# Patient Record
Sex: Male | Born: 1955 | Race: Asian | Hispanic: No | Marital: Married | Smoking: Never smoker
Health system: Southern US, Community
[De-identification: ages and names within clinical notes are randomized; demographics above are authoritative.]

## PROBLEM LIST (undated history)

## (undated) DIAGNOSIS — E119 Type 2 diabetes mellitus without complications: Secondary | ICD-10-CM

## (undated) DIAGNOSIS — E785 Hyperlipidemia, unspecified: Secondary | ICD-10-CM

---

## 2006-10-15 ENCOUNTER — Emergency Department (HOSPITAL_COMMUNITY): Admission: EM | Admit: 2006-10-15 | Discharge: 2006-10-15 | Payer: Self-pay | Admitting: Emergency Medicine

## 2008-12-25 ENCOUNTER — Emergency Department (HOSPITAL_COMMUNITY): Admission: EM | Admit: 2008-12-25 | Discharge: 2008-12-25 | Payer: Self-pay | Admitting: Family Medicine

## 2010-04-13 ENCOUNTER — Emergency Department (HOSPITAL_COMMUNITY): Admission: EM | Admit: 2010-04-13 | Discharge: 2010-04-13 | Payer: Self-pay | Admitting: Family Medicine

## 2010-08-29 ENCOUNTER — Emergency Department (HOSPITAL_COMMUNITY)
Admission: EM | Admit: 2010-08-29 | Discharge: 2010-08-29 | Payer: Self-pay | Source: Home / Self Care | Admitting: Family Medicine

## 2011-01-27 ENCOUNTER — Inpatient Hospital Stay (INDEPENDENT_AMBULATORY_CARE_PROVIDER_SITE_OTHER)
Admission: RE | Admit: 2011-01-27 | Discharge: 2011-01-27 | Disposition: A | Discharge status: Home / Self Care | Payer: BC Managed Care – PPO | Source: Ambulatory Visit | Attending: Family Medicine | Admitting: Family Medicine

## 2011-01-27 DIAGNOSIS — M779 Enthesopathy, unspecified: Secondary | ICD-10-CM

## 2011-07-01 ENCOUNTER — Other Ambulatory Visit: Payer: Self-pay | Admitting: Gastroenterology

## 2011-07-01 DIAGNOSIS — B181 Chronic viral hepatitis B without delta-agent: Secondary | ICD-10-CM

## 2011-07-02 ENCOUNTER — Ambulatory Visit
Admission: RE | Admit: 2011-07-02 | Discharge: 2011-07-02 | Disposition: A | Discharge status: Home / Self Care | Payer: BC Managed Care – PPO | Source: Ambulatory Visit | Attending: Gastroenterology | Admitting: Gastroenterology

## 2011-07-02 DIAGNOSIS — B181 Chronic viral hepatitis B without delta-agent: Secondary | ICD-10-CM

## 2015-11-27 ENCOUNTER — Encounter (HOSPITAL_COMMUNITY): Payer: Self-pay

## 2015-11-27 ENCOUNTER — Emergency Department (HOSPITAL_COMMUNITY)
Admission: EM | Admit: 2015-11-27 | Discharge: 2015-11-27 | Disposition: A | Discharge status: Home / Self Care | Payer: Worker's Compensation | Attending: Emergency Medicine | Admitting: Emergency Medicine

## 2015-11-27 ENCOUNTER — Emergency Department (HOSPITAL_COMMUNITY): Payer: Worker's Compensation

## 2015-11-27 DIAGNOSIS — S61313A Laceration without foreign body of left middle finger with damage to nail, initial encounter: Secondary | ICD-10-CM | POA: Diagnosis present

## 2015-11-27 DIAGNOSIS — Y999 Unspecified external cause status: Secondary | ICD-10-CM | POA: Diagnosis not present

## 2015-11-27 DIAGNOSIS — Y9289 Other specified places as the place of occurrence of the external cause: Secondary | ICD-10-CM | POA: Diagnosis not present

## 2015-11-27 DIAGNOSIS — W260XXA Contact with knife, initial encounter: Secondary | ICD-10-CM | POA: Diagnosis not present

## 2015-11-27 DIAGNOSIS — Z23 Encounter for immunization: Secondary | ICD-10-CM | POA: Diagnosis not present

## 2015-11-27 DIAGNOSIS — Y9389 Activity, other specified: Secondary | ICD-10-CM | POA: Diagnosis not present

## 2015-11-27 DIAGNOSIS — S61309A Unspecified open wound of unspecified finger with damage to nail, initial encounter: Secondary | ICD-10-CM

## 2015-11-27 MED ORDER — IBUPROFEN 800 MG PO TABS: 800.0000 mg | ORAL_TABLET | Freq: Three times a day (TID) | ORAL | Status: DC

## 2015-11-27 MED ORDER — CEPHALEXIN 500 MG PO CAPS
500.0000 mg | ORAL_CAPSULE | Freq: Once | ORAL | Status: AC
  Administered 2015-11-27: 500 mg via ORAL
  Filled 2015-11-27: qty 1

## 2015-11-27 MED ORDER — TETANUS-DIPHTH-ACELL PERTUSSIS 5-2.5-18.5 LF-MCG/0.5 IM SUSP
INTRAMUSCULAR | Status: AC
  Filled 2015-11-27: qty 0.5

## 2015-11-27 MED ORDER — TETANUS-DIPHTH-ACELL PERTUSSIS 5-2.5-18.5 LF-MCG/0.5 IM SUSP
0.5000 mL | Freq: Once | INTRAMUSCULAR | Status: AC
Start: 2015-11-27 — End: 2015-11-27
  Administered 2015-11-27: 0.5 mL via INTRAMUSCULAR

## 2015-11-27 MED ORDER — CEPHALEXIN 500 MG PO CAPS: 500.0000 mg | ORAL_CAPSULE | Freq: Four times a day (QID) | ORAL | Status: DC

## 2015-11-27 NOTE — ED Notes (Signed)
Pt had cut self with knife to left middle finger around 1730, denies numbness, denies any medical hx or allergies, denies taking ASA

## 2015-11-27 NOTE — Discharge Instructions (Signed)
Nail Bed Injury  The nail bed is the soft tissue under a fingernail or toenail. Various types of injuries can occur at the nail bed. These may involve bruising or bleeding under the nail, cuts in the nail or nail bed, or loss of the nail or a part of it. It can take several months for a damaged nail to regrow. In some cases, the nail may not grow back normally.  HOME CARE  · Raise (elevate) the injured part to lessen pain and puffiness (swelling).      For an injured toenail, lie with your leg on pillows. Avoid walking or letting your leg dangle. When you walk, wear an open-toe shoe.      For an injured fingernail, keep your hand above the level of your heart. Use pillows on a table or on the arm of your chair while sitting. Use pillows on your bed while sleeping.    · Use bandages or splints as told by your doctor.    · Keep your bandage (dressing) clean and dry. Change your bandage as told by your doctor.    · Only take medicine as told by your doctor.    · See your doctor as needed.  GET HELP RIGHT AWAY IF:   · You have more pain, leaking fluid (drainage), or bleeding in the injured area.    · You have redness, soreness, and puffiness (inflammation) in the injured area.  · You have a fever or lasting symptoms for more than 2-3 days.  · You have a fever and your symptoms suddenly get worse.  · You have puffiness that spreads from your finger into your hand or from your toe into your foot.    MAKE SURE YOU:  · Understand these instructions.  · Will watch this condition.  · Will get help right away if you are not doing well or get worse.     This information is not intended to replace advice given to you by your health care provider. Make sure you discuss any questions you have with your health care provider.     Document Released: 07/22/2009 Document Revised: 11/28/2012 Document Reviewed: 08/25/2012  Elsevier Interactive Patient Education ©2016 Elsevier Inc.

## 2015-11-27 NOTE — ED Notes (Signed)
Pt cut his L middle finger on a machine at work.

## 2015-11-29 ENCOUNTER — Encounter (HOSPITAL_COMMUNITY): Payer: Self-pay | Admitting: Emergency Medicine

## 2015-11-29 ENCOUNTER — Emergency Department (HOSPITAL_COMMUNITY)
Admission: EM | Admit: 2015-11-29 | Discharge: 2015-11-29 | Disposition: A | Discharge status: Home / Self Care | Payer: Worker's Compensation | Attending: Emergency Medicine | Admitting: Emergency Medicine

## 2015-11-29 DIAGNOSIS — Z4801 Encounter for change or removal of surgical wound dressing: Secondary | ICD-10-CM | POA: Insufficient documentation

## 2015-11-29 DIAGNOSIS — Z5189 Encounter for other specified aftercare: Secondary | ICD-10-CM

## 2015-11-29 NOTE — Discharge Instructions (Signed)
The wound to the left middle finger is healing nicely. Please wash the area gently with soap and water. Use the splint and dressing for another 5 days. Please finish the keflex. Use the ibuprofen three times daily with food if needed for pain. Return to the Emergency Department if not improving. Fingernail or Toenail Removal, Care After Refer to this sheet in the next few weeks. These instructions provide you with information about caring for yourself after your procedure. Your health care provider may also give you more specific instructions. Your treatment has been planned according to current medical practices, but problems sometimes occur. Call your health care provider if you have any problems or questions after your procedure. WHAT TO EXPECT AFTER THE PROCEDURE After your procedure, it is common to have:  Redness.  Swelling. HOME CARE INSTRUCTIONS  If you have a splint on your finger:  Wear it as directed by your health care provider. Remove it only as directed by your health care provider.  Loosen the splint if your fingers become numb and tingle, or if they turn cold and blue.  If you were given a surgical shoe, wear it as directed by your health care provider.  Take medicines only as directed by your health care provider.  Elevate your hand or foot as much of the time as possible. This helps with pain and swelling.  If you are recovering from fingernail removal, keep your hand raised above the level of your heart.  If you are recovering from toenail removal, lie on a bed or a couch with your leg propped up on pillows, or sit in a reclining chair with the footrest up.  Follow instructions from your health care provider about bandage (dressing) changes and removal:  Change your dressing 24 hours after your procedure or as directed by your health care provider.  Soak your hand or foot in warm, soapy water for 10-20 minutes or as directed by your health care provider. Do this 3  times per day or as directed by your health care provider. This reduces pain and swelling.  After you soak your hand or foot, apply a clean, dry dressing.  Keep your dressing clean and dry. Change your dressing whenever it gets wet or dirty.  Keep all follow-up visits as directed by your health care provider. This is important. SEEK MEDICAL CARE IF:  You have increased redness or pain at your nail area.  You have increased fluid, blood, or pus coming from your nail area.  There is a bad smell coming from the dressing.  You have a fever.  Your swelling gets worse, or you have swelling that spreads from your finger to your hand or from your toe to your foot.  You have worsening redness that spreads from your finger to your hand or from your toe up to your foot.  Your finger or toe looks blue or black.   This information is not intended to replace advice given to you by your health care provider. Make sure you discuss any questions you have with your health care provider.   Document Released: 08/24/2014 Document Reviewed: 08/24/2014 Elsevier Interactive Patient Education [employer]2016 Elsevier Inc.

## 2015-11-29 NOTE — ED Notes (Signed)
Finger splint and dressing removed for assessment.

## 2015-11-29 NOTE — ED Notes (Addendum)
Pt here for follow up in regards to an avulsion of LT, middle fingernail. Pt treated in ED by Pauline Ausammy Triplett on 11/27/15. Pt has not other complaints. Pt is not [place] speaking. Grandson at bedside. Pt has splint on finger. No drainage noted.

## 2015-11-29 NOTE — ED Provider Notes (Signed)
CSN: [id number]     Arrival date & time 11/29/15  [id number] History   First MD Initiated Contact with Patient 11/29/15 1002     Chief Complaint  Patient presents with  . Wound Check     (Consider location/radiation/quality/duration/timing/severity/associated sxs/prior Treatment) HPI Comments: Patient is a 60 year old male who presents to the emergency department for wound recheck of his left middle finger.  The patient has limited [place], and his grandson is serving as his interpreter at the bedside.  The patient had an avulsion of the nail of the left middle finger on April 12. He states that he cut himself with a knife at that time. The patient was evaluated the patient was placed in a splint and dressing was applied. The patient was placed on antibiotic therapy and pain management therapy. He returns today for recheck of his wound. He denies any problems with fever. He's not had any problem with bleeding. He has some mild soreness, but no serious pain.  Patient is a 60 y.o. male presenting with wound check. The history is provided by the patient and a relative. The history is limited by a language barrier. A language interpreter was used.  Wound Check The current episode started in the past 7 days. Pertinent negatives include no abdominal pain, arthralgias, chest pain, coughing or neck pain.    History reviewed. No pertinent past medical history. History reviewed. No pertinent past surgical history. No family history on file. Social History  Substance Use Topics  . Smoking status: Never Smoker   . Smokeless tobacco: None  . Alcohol Use: Yes     Comment: occ    Review of Systems  Constitutional: Negative for activity change.       All ROS Neg except as noted in HPI  HENT: Negative for nosebleeds.   Eyes: Negative for photophobia and discharge.  Respiratory: Negative for cough, shortness of breath and wheezing.   Cardiovascular: Negative for chest pain and palpitations.   Gastrointestinal: Negative for abdominal pain and blood in stool.  Genitourinary: Negative for dysuria, frequency and hematuria.  Musculoskeletal: Negative for back pain, arthralgias and neck pain.  Skin: Positive for wound.  Neurological: Negative for dizziness, seizures and speech difficulty.  Psychiatric/Behavioral: Negative for hallucinations and confusion.      Allergies  Review of patient's allergies indicates no known allergies.  Home Medications   Prior to Admission medications   Medication Sig Start Date End Date Taking? Authorizing Provider  cephALEXin (KEFLEX) 500 MG capsule Take 1 capsule (500 mg total) by mouth 4 (four) times daily. For 7 days 11/27/15   Tammy Triplett, PA-C  ibuprofen (ADVIL,MOTRIN) 800 MG tablet Take 1 tablet (800 mg total) by mouth 3 (three) times daily. 11/27/15   Tammy Triplett, PA-C   BP 148/104 mmHg  Pulse 71  Temp(Src) 97.5 F (36.4 C) (Axillary)  Resp 14  SpO2 100% Physical Exam  Constitutional: He is oriented to person, place, and time. He appears well-developed and well-nourished.  Non-toxic appearance.  HENT:  Head: Normocephalic.  Right Ear: Tympanic membrane and external ear normal.  Left Ear: Tympanic membrane and external ear normal.  Eyes: EOM and lids are normal. Pupils are equal, round, and reactive to light.  Neck: Normal range of motion. Neck supple. Carotid bruit is not present.  Cardiovascular: Normal rate, regular rhythm, normal heart sounds, intact distal pulses and normal pulses.   Pulmonary/Chest: Breath sounds normal. No respiratory distress.  Abdominal: Soft. Bowel sounds are normal. There is no tenderness.  There is no guarding.  Musculoskeletal: Normal range of motion.  The avulsed area of the left middle finger nail is healing nicely. No red streaks. The finger is not hot. No palpable nodes of the bicep area. Good ROM of the left middle finger, and all fingers of the left hand.  Lymphadenopathy:       Head (right  side): No submandibular adenopathy present.       Head (left side): No submandibular adenopathy present.    He has no cervical adenopathy.  Neurological: He is alert and oriented to person, place, and time. He has normal strength. No cranial nerve deficit or sensory deficit.  Skin: Skin is warm and dry.  Psychiatric: He has a normal mood and affect. His speech is normal.  Nursing note and vitals reviewed.   ED Course  Procedures (including critical care time) Labs Review Labs Reviewed - No data to display  Imaging Review Dg Finger Middle Left  11/27/2015  CLINICAL DATA:  Long finger laceration at work today. EXAM: LEFT MIDDLE FINGER 2+V COMPARISON:  None. FINDINGS: Distal soft tissue injury is noted with skin irregularity, localizing to the nail bed on the lateral view. There are several tiny radiodensities within the soft tissues, suspicious for foreign bodies. No evidence of acute fracture, dislocation or bone destruction. Mild interphalangeal degenerative changes are present throughout the visualized fingers. IMPRESSION: Distal soft tissue injury in the long finger with probable associated small foreign bodies. No acute osseous findings. Electronically Signed   By: Carey Bullocks M.D.   On: 11/27/2015 19:12   I have personally reviewed and evaluated these images and lab results as part of my medical decision-making.   EKG Interpretation None      MDM  The avulsed area of the left middle finger is progressing nicely. There is no red streaks, there is no signs of advancing infection. The patient has good movement of the middle finger. I have advised the patient to continue the Keflex and the ibuprofen. To continue to use the dressing and splint over the next 5 days. The patient is to see his primary physician, or return to the emergency department if not improving. The patient acknowledges understanding of these discharge instructions through his grandson who is serving as his  interpreter.    Final diagnoses:  Visit for wound check    **I have reviewed nursing notes, vital signs, and all appropriate lab and imaging results for this patient.Ivery Quale, PA-C 11/29/15 1036  Glynn Octave, MD 11/29/15 1249

## 2015-11-29 NOTE — ED Provider Notes (Signed)
CSN: [id number]     Arrival date & time 11/27/15  1720 History   First MD Initiated Contact with Patient 11/27/15 1813     Chief Complaint  Patient presents with  . Laceration     (Consider location/radiation/quality/duration/timing/severity/associated sxs/prior Treatment) Patient is a 60 y.o. male presenting with skin laceration. The history is provided by a friend. The history is limited by a language barrier. A language interpreter was used.  Laceration   Nicholas Johnston is a 60 y.o. male who presents to the Emergency Department complaining of a work related injury to the left middle finger that occurred shortly before ED arrival.  He states that he cut the fingernail on a machine and cannot control the bleeding.  He denies numbness, difficulty moving the finger, and swelling.  Td is unknown.     History reviewed. No pertinent past medical history. History reviewed. No pertinent past surgical history. No family history on file. Social History  Substance Use Topics  . Smoking status: Never Smoker   . Smokeless tobacco: None  . Alcohol Use: Yes     Comment: occ    Review of Systems  Constitutional: Negative for fever and chills.  Musculoskeletal: Negative for back pain, joint swelling and arthralgias.  Skin: Positive for wound.       Injury to the fingernail of the left middle finger  Neurological: Negative for dizziness, weakness and numbness.  Hematological: Does not bruise/bleed easily.  All other systems reviewed and are negative.     Allergies  Review of patient's allergies indicates no known allergies.  Home Medications   Prior to Admission medications   Medication Sig Start Date End Date Taking? Authorizing Provider  cephALEXin (KEFLEX) 500 MG capsule Take 1 capsule (500 mg total) by mouth 4 (four) times daily. For 7 days 11/27/15   Kandance Yano, PA-C  ibuprofen (ADVIL,MOTRIN) 800 MG tablet Take 1 tablet (800 mg total) by mouth 3 (three) times daily. 11/27/15   Cass Edinger, PA-C   BP 160/103 mmHg  Pulse 77  Temp(Src) 98 F (36.7 C) (Oral)  Resp 16  Ht  (1.651 m)  Wt 71.85 kg  BMI 26.36 kg/m2  SpO2 100% Physical Exam  Constitutional: He is oriented to person, place, and time. He appears well-developed and well-nourished. No distress.  HENT:  Head: Normocephalic and atraumatic.  Cardiovascular: Normal rate, regular rhythm and intact distal pulses.   No murmur heard. Pulmonary/Chest: Effort normal and breath sounds normal. No respiratory distress.  Musculoskeletal: He exhibits no edema or tenderness.  Pt has FROM of the left middle finger.  Distal sensation intact  Neurological: He is alert and oriented to person, place, and time. He exhibits normal muscle tone. Coordination normal.  Skin: Skin is warm. Laceration noted.  Partial avulsion of the central fingernail of the left middle finger.  Small bleeder exposed.  Margins of the nail and skin of the finger intact.   Nursing note and vitals reviewed.   ED Course  Procedures (including critical care time) Labs Review Labs Reviewed - No data to display  Imaging Review Dg Finger Middle Left  11/27/2015  CLINICAL DATA:  Long finger laceration at work today. EXAM: LEFT MIDDLE FINGER 2+V COMPARISON:  None. FINDINGS: Distal soft tissue injury is noted with skin irregularity, localizing to the nail bed on the lateral view. There are several tiny radiodensities within the soft tissues, suspicious for foreign bodies. No evidence of acute fracture, dislocation or bone destruction. Mild interphalangeal degenerative changes  are present throughout the visualized fingers. IMPRESSION: Distal soft tissue injury in the long finger with probable associated small foreign bodies. No acute osseous findings. Electronically Signed   By: Carey BullocksWilliam  Veazey M.D.   On: 11/27/2015 19:12   I have personally reviewed and evaluated these images and lab results as part of my medical decision-making.   Procedure: Wound  cleaned with saline, bleeding controlled using quick clot powder and direct pressure applied.  Bleeding controlled. No suturable injury.  MDM   Final diagnoses:  Avulsion of fingernail, initial encounter    Pt has been observed without further bleeding.  NVI.  Finger bandaged and splinted.  rx for ibuprofen and keflex.  Td updated.  Pt agrees to return here in 2 days for recheck    Nicholas Ausammy Joyell Emami, PA-C 11/29/15 1656  Bethann BerkshireJoseph Zammit, MD 11/30/15 [phone]

## 2017-09-19 IMAGING — DX DG FINGER MIDDLE 2+V*L*
3 series · 3 of 3 positions shown · non-contrast
Comparison: None.

CLINICAL DATA: Long finger laceration at work today.

EXAM:
LEFT MIDDLE FINGER 2+V

[finger ap]
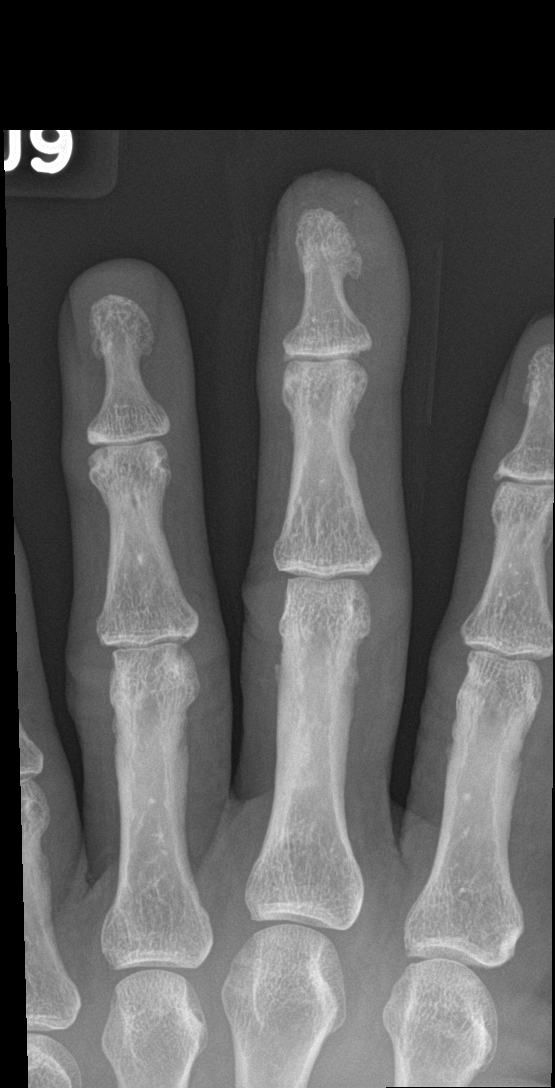

[finger obl]
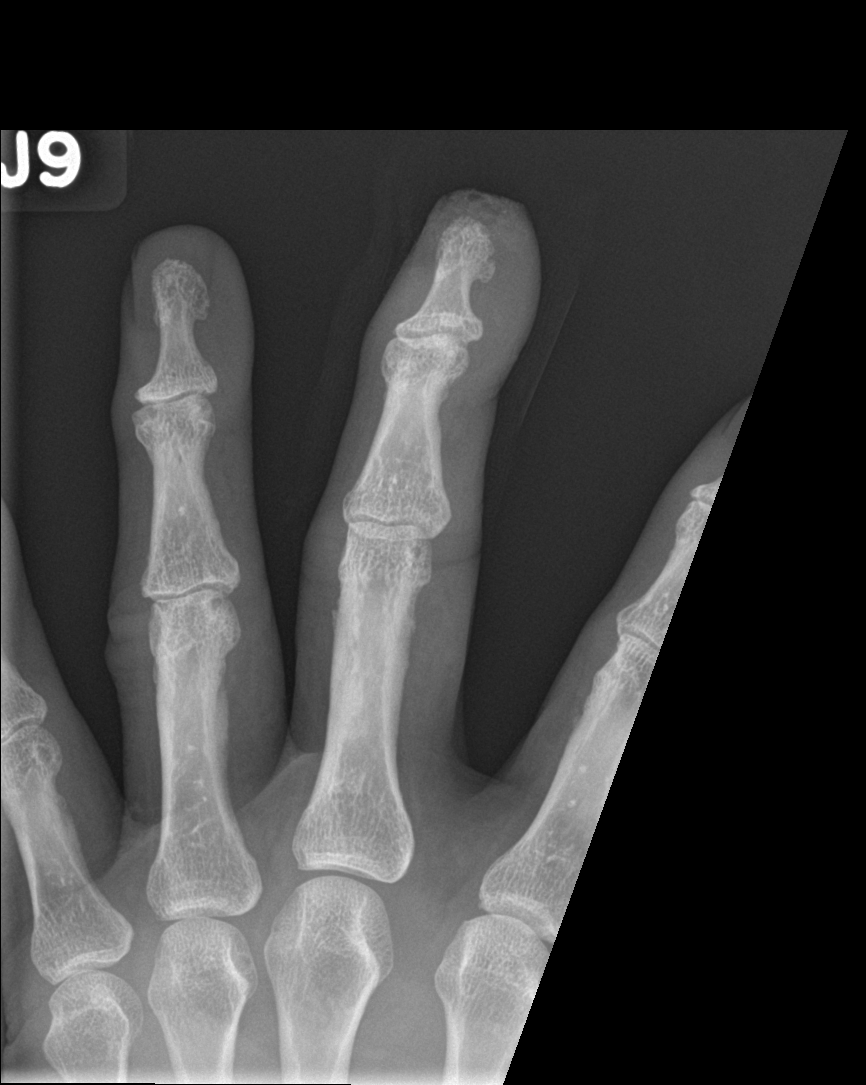

[finger lat]
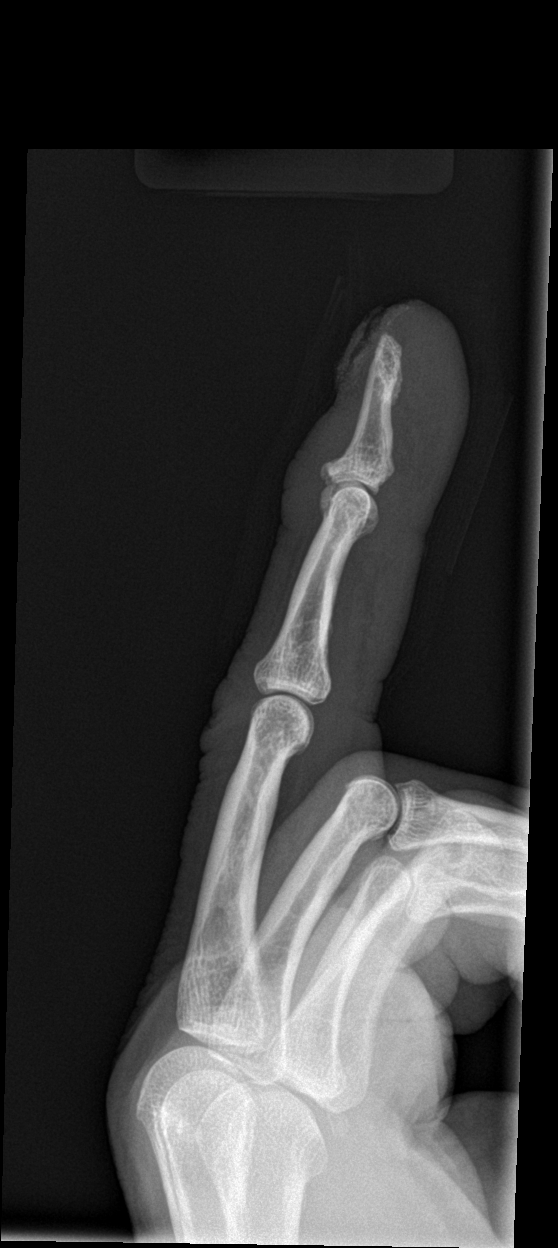

[3 of 3 positions shown; findings below may reference images not displayed]

FINDINGS: Distal soft tissue injury is noted with skin irregularity,
localizing to the nail bed on the lateral view. There are several
tiny radiodensities within the soft tissues, suspicious for foreign
bodies. No evidence of acute fracture, dislocation or bone
destruction. Mild interphalangeal degenerative changes are present
throughout the visualized fingers.
IMPRESSION: Distal soft tissue injury in the long finger with probable
associated small foreign bodies. No acute osseous findings.

## 2019-09-26 ENCOUNTER — Other Ambulatory Visit: Payer: Self-pay | Admitting: Gastroenterology

## 2019-09-26 DIAGNOSIS — B181 Chronic viral hepatitis B without delta-agent: Secondary | ICD-10-CM

## 2020-02-29 ENCOUNTER — Ambulatory Visit: Payer: Self-pay | Attending: Internal Medicine

## 2020-02-29 DIAGNOSIS — Z23 Encounter for immunization: Secondary | ICD-10-CM

## 2020-02-29 NOTE — Progress Notes (Signed)
° °  Cov[facility] Vaccination [facility]  Name:  Othniel Maret    MRN: [id number] DOB: Sep 04, 1955  02/29/2020  Mr. Hammond was observed post Covid-19 immunization for 15 minutes without incident. He was provided with Vaccine Information Sheet and instruction to access the V-Safe system.   Mr. Wellbrock was instructed to call 911 with any severe reactions post vaccine:  Difficulty breathing   Swelling of face and throat   A fast heartbeat   A bad rash all over body   Dizziness and weakness   Immunizations Administered    Name Date Dose VIS Date Route   Pfizer COVID-19 Vaccine 02/29/2020 10:35 AM 0.3 mL 10/11/2018 Intramuscular   Manufacturer: [employer], [employer]   Lot: [id number]   NDC: [address]-8

## 2020-04-02 ENCOUNTER — Ambulatory Visit: Payer: Self-pay

## 2021-12-26 DIAGNOSIS — M2550 Pain in unspecified joint: Secondary | ICD-10-CM | POA: Diagnosis not present

## 2021-12-26 DIAGNOSIS — E559 Vitamin D deficiency, unspecified: Secondary | ICD-10-CM | POA: Diagnosis not present

## 2021-12-26 DIAGNOSIS — E1165 Type 2 diabetes mellitus with hyperglycemia: Secondary | ICD-10-CM | POA: Diagnosis not present

## 2021-12-26 DIAGNOSIS — E782 Mixed hyperlipidemia: Secondary | ICD-10-CM | POA: Diagnosis not present

## 2021-12-26 DIAGNOSIS — M25519 Pain in unspecified shoulder: Secondary | ICD-10-CM | POA: Diagnosis not present

## 2022-04-14 ENCOUNTER — Ambulatory Visit (HOSPITAL_COMMUNITY)
Admission: EM | Admit: 2022-04-14 | Discharge: 2022-04-14 | Disposition: A | Discharge status: Home / Self Care | Payer: BC Managed Care – PPO | Attending: Emergency Medicine | Admitting: Emergency Medicine

## 2022-04-14 ENCOUNTER — Emergency Department (HOSPITAL_COMMUNITY)
Admission: EM | Admit: 2022-04-14 | Discharge: 2022-04-14 | Disposition: A | Discharge status: Home / Self Care | Payer: BC Managed Care – PPO | Attending: Emergency Medicine | Admitting: Emergency Medicine

## 2022-04-14 ENCOUNTER — Other Ambulatory Visit: Payer: Self-pay

## 2022-04-14 ENCOUNTER — Encounter (HOSPITAL_COMMUNITY): Payer: Self-pay | Admitting: *Deleted

## 2022-04-14 ENCOUNTER — Ambulatory Visit (INDEPENDENT_AMBULATORY_CARE_PROVIDER_SITE_OTHER): Payer: BC Managed Care – PPO

## 2022-04-14 ENCOUNTER — Encounter (HOSPITAL_COMMUNITY): Payer: Self-pay

## 2022-04-14 DIAGNOSIS — X58XXXA Exposure to other specified factors, initial encounter: Secondary | ICD-10-CM | POA: Diagnosis not present

## 2022-04-14 DIAGNOSIS — Z23 Encounter for immunization: Secondary | ICD-10-CM | POA: Insufficient documentation

## 2022-04-14 DIAGNOSIS — S92532A Displaced fracture of distal phalanx of left lesser toe(s), initial encounter for closed fracture: Secondary | ICD-10-CM | POA: Diagnosis not present

## 2022-04-14 DIAGNOSIS — S91219A Laceration without foreign body of unspecified toe(s) with damage to nail, initial encounter: Secondary | ICD-10-CM

## 2022-04-14 DIAGNOSIS — Z9181 History of falling: Secondary | ICD-10-CM | POA: Insufficient documentation

## 2022-04-14 DIAGNOSIS — S92532B Displaced fracture of distal phalanx of left lesser toe(s), initial encounter for open fracture: Secondary | ICD-10-CM | POA: Diagnosis not present

## 2022-04-14 DIAGNOSIS — S99922A Unspecified injury of left foot, initial encounter: Secondary | ICD-10-CM

## 2022-04-14 DIAGNOSIS — S91115A Laceration without foreign body of left lesser toe(s) without damage to nail, initial encounter: Secondary | ICD-10-CM | POA: Diagnosis not present

## 2022-04-14 DIAGNOSIS — S97122A Crushing injury of left lesser toe(s), initial encounter: Secondary | ICD-10-CM

## 2022-04-14 DIAGNOSIS — S91215A Laceration without foreign body of left lesser toe(s) with damage to nail, initial encounter: Secondary | ICD-10-CM

## 2022-04-14 MED ORDER — BUPIVACAINE HCL (PF) 0.5 % IJ SOLN
10.0000 mL | Freq: Once | INTRAMUSCULAR | Status: AC
  Administered 2022-04-14: 10 mL
  Filled 2022-04-14: qty 10

## 2022-04-14 MED ORDER — CEPHALEXIN 500 MG PO CAPS: 500.0000 mg | ORAL_CAPSULE | Freq: Four times a day (QID) | ORAL | 0 refills | Status: DC

## 2022-04-14 MED ORDER — HYDROCODONE-ACETAMINOPHEN 5-325 MG PO TABS: 1.0000 | ORAL_TABLET | Freq: Four times a day (QID) | ORAL | 0 refills | Status: DC | PRN

## 2022-04-14 MED ORDER — MORPHINE SULFATE (PF) 2 MG/ML IV SOLN
2.0000 mg | Freq: Once | INTRAVENOUS | Status: AC
  Administered 2022-04-14: 2 mg via INTRAMUSCULAR

## 2022-04-14 MED ORDER — CEPHALEXIN 250 MG PO CAPS
500.0000 mg | ORAL_CAPSULE | Freq: Once | ORAL | Status: AC
  Administered 2022-04-14: 500 mg via ORAL
  Filled 2022-04-14: qty 2

## 2022-04-14 MED ORDER — TETANUS-DIPHTH-ACELL PERTUSSIS 5-2.5-18.5 LF-MCG/0.5 IM SUSY
0.5000 mL | PREFILLED_SYRINGE | Freq: Once | INTRAMUSCULAR | Status: AC
  Administered 2022-04-14: 0.5 mL via INTRAMUSCULAR
  Filled 2022-04-14: qty 0.5

## 2022-04-14 MED ORDER — MORPHINE SULFATE (PF) 4 MG/ML IV SOLN
INTRAVENOUS | Status: AC
  Filled 2022-04-14: qty 1

## 2022-04-14 NOTE — ED Provider Notes (Signed)
[facility] EMERGENCY DEPARTMENT Provider Note   CSN: [id number] Arrival date & time: 04/14/22  1247     History  Chief Complaint  Patient presents with   Toe Injury    Godwin Tedesco is a 66 y.o. male.  Patient presents the emergency department for evaluation of left third toe laceration, open comminuted fracture.  Patient dropped a pallet of wood on the area at around 10 AM today.  He was at urgent care and then referred to the emergency department where he had an extended wait time.  Complains of pain in the toe.  Unknown last tetanus.  No history of diabetes or immunocompromise.  Family member at bedside interprets, declines medical interpreter.       Home Medications Prior to Admission medications   Medication Sig Start Date End Date Taking? Authorizing Provider  cephALEXin (KEFLEX) 500 MG capsule Take 1 capsule (500 mg total) by mouth 4 (four) times daily. For 7 days 11/27/15   Triplett, Tammy, PA-C  ibuprofen (ADVIL,MOTRIN) 800 MG tablet Take 1 tablet (800 mg total) by mouth 3 (three) times daily. 11/27/15   Triplett, Tammy, PA-C      Allergies    Patient has no known allergies.    Review of Systems   Review of Systems  Physical Exam Updated Vital Signs BP 113/72 (BP Location: Right Arm)   Pulse 74   Temp 98.9 F (37.2 C) (Oral)   Resp 18   Ht 5\' 5"  (1.651 m)   Wt 71.7 kg   SpO2 98%   BMI 26.29 kg/m   Physical Exam Vitals and nursing note reviewed.  Constitutional:      Appearance: He is well-developed.  HENT:     Head: Normocephalic and atraumatic.  Eyes:     Conjunctiva/sclera: Conjunctivae normal.  Pulmonary:     Effort: No respiratory distress.  Musculoskeletal:     Cervical back: Normal range of motion and neck supple.     Comments: Right third toe: Deep complex laceration noted.  Extends approximately 3 cm total length.  Wound is irregular.  There is irregular macerated nailbed laceration.  Toenail is mostly avulsed from the nail bed  and able to be moved to the side.  Base of wound appears clean.  Appears to be open underlying fracture, however no bony fragments visualized.  Remainder of foot appears to be without injury.  Skin:    General: Skin is warm and dry.  Neurological:     Mental Status: He is alert.         ED Results / Procedures / Treatments   Labs (all labs ordered are listed, but only abnormal results are displayed) Labs Reviewed - No data to display  EKG None  Radiology DG Toe 3rd Left  Result Date: 04/14/2022 CLINICAL DATA:  Crush injury toe today EXAM: LEFT THIRD TOE COMPARISON:  None Available. FINDINGS: Comminuted fracture of the distal phalanx of the third toe. Multiple displaced fracture fragments. Fracture extends into the D IP joint. No other fracture or arthropathy. IMPRESSION: Comminuted fracture of the distal third phalanx. Electronically Signed   By: 04/16/2022 M.D.   On: 04/14/2022 12:25    Procedures .08/31/2023Laceration Repair  Date/Time: 04/14/2022 10:28 PM  Performed by: 04/16/2022, PA-C Authorized by: Renne Crigler, PA-C   Consent:    Consent obtained:  Verbal   Consent given by:  Patient   Risks discussed:  Infection, poor cosmetic result, poor wound healing, need for additional repair and  pain Universal protocol:    Patient identity confirmed:  Verbally with patient Anesthesia:    Anesthesia method:  Nerve block   Block location:  L 3rd toe   Block needle gauge:  27 G   Block anesthetic:  Bupivacaine 0.5% w/o epi   Block technique:  3-sided ring block   Block injection procedure:  Anatomic landmarks identified, introduced needle, incremental injection, negative aspiration for blood and anatomic landmarks palpated   Block outcome:  Anesthesia achieved Laceration details:    Location:  Toe   Toe location:  L third toe   Length (cm):  3 Pre-procedure details:    Preparation:  Imaging obtained to evaluate for foreign bodies and patient was prepped and draped in  usual sterile fashion Exploration:    Hemostasis achieved with:  Tourniquet (medium)   Imaging obtained: x-ray     Imaging outcome: foreign body not noted     Wound exploration: wound explored through full range of motion     Wound extent: underlying fracture     Wound extent: no foreign bodies/material noted     Contaminated: no   Treatment:    Amount of cleaning:  Extensive   Irrigation solution:  Sterile water   Irrigation volume:  1000cc   Irrigation method:  Pressure wash (bottle cap nozzle) Skin repair:    Repair method:  Sutures   Suture size:  5-0   Suture material:  Chromic gut and nylon   Suture technique:  Simple interrupted   Number of sutures:  13 (3 chromic for nailbed repair, 10 for toe laceration) Approximation:    Approximation:  Close Repair type:    Repair type:  Complex Post-procedure details:    Dressing:  Bulky dressing   Procedure completion:  Tolerated well, no immediate complications     Medications Ordered in ED Medications  Tdap (BOOSTRIX) injection 0.5 mL (0.5 mLs Intramuscular Given 04/14/22 2024)  bupivacaine(PF) (MARCAINE) 0.5 % injection 10 mL (10 mLs Infiltration Given by Other 04/14/22 2028)  cephALEXin (KEFLEX) capsule 500 mg (500 mg Oral Given 04/14/22 2025)    ED Course/ Medical Decision Making/ A&P    Patient seen and examined. History obtained directly from patient and family member at bedside. Work-up including labs, imaging, EKG ordered in triage, if performed, were reviewed.    Labs/EKG: None ordered  Imaging: Independently visualized and interpreted.  X-ray of toe, agree comminuted distal phalanx fracture.  Medications/Fluids: Ordered: Tdap, oral Keflex, bupivacaine 0.5%.  Most recent vital signs reviewed and are as follows: BP 113/72 (BP Location: Right Arm)   Pulse 74   Temp 98.9 F (37.2 C) (Oral)   Resp 18   Ht 5\' 5"  (1.651 m)   Wt 71.7 kg   SpO2 98%   BMI 26.29 kg/m   Initial impression: Comminuted, open, distal  phalanx fracture of left third toe.  I recommended digital block, irrigation, exploration of wound and repair.  We discussed risks and benefits of block in wound repair.  Discussed that there is high likelihood of permanent toenail damage.  Also discussed potential for infection given open wound, including the risk of osteomyelitis or bone infection, which we will hope to mitigate with irrigation and oral antibiotics.  Will provide information for podiatry follow-up.  Patient agrees to proceed.  10:31 PM patient was discussed with and seen by Dr. .  Wound repaired per note.  After irrigating the wound, I completed removal of the toenail and cleaned this with sterile water.  Wound  was sutured as above.  The nailbed laceration was fairly irregular.  I was able to reconstruct the eponychial fold and after wound repair, we replaced the nail plate into the fold and secured with 2 nylon sutures.  Reviewed pertinent lab work and imaging with patient at bedside. Questions answered.   Most current vital signs reviewed and are as follows: BP (!) 145/91 (BP Location: Right Arm)   Pulse 82   Temp 99.1 F (37.3 C) (Oral)   Resp 18   Ht 5\' 5"  (1.651 m)   Wt 71.7 kg   SpO2 100%   BMI 26.29 kg/m   Plan: Discharge to home.   Prescriptions written for: Vicodin #6, Keflex  Patient counseled on use of narcotic pain medications. Counseled not to combine these medications with others containing tylenol. Urged not to drink alcohol, drive, or perform any other activities that requires focus while taking these medications. The patient verbalizes understanding and agrees with the plan.  Other home care instructions discussed: Patient counseled on wound care.    ED return instructions discussed: Patient was urged to return to the Emergency Department urgently with worsening pain, swelling, expanding erythema especially if it streaks away from the affected area, fever, or if they have any other concerns.    Follow-up instructions discussed: Patient counseled on podiatry follow-up in 3-5 days.                           Medical Decision Making Risk Prescription drug management.   Patient with open distal phalanx fracture of the left third toe, complex laceration of the toe involving the nailbed and nail fold.  This was repaired as best as possible in the emergency department after copious irrigation.  X-ray demonstrates comminuted fracture.  Fortunately patient is not immunosuppressed and does not have a history of diabetes.  He was treated with Tdap and Keflex and will be continued on Keflex for 1 week.  Given complexity of wound and fracture, advise close podiatry follow-up to ensure appropriate wound healing.  High risk of poor regrowth of nail given location of wound.  Patient unfortunately with prolonged ED wait time, however wound was repaired and cleaned within 12 hours of incident.  The patient's vital signs, pertinent lab work and imaging were reviewed and interpreted as discussed in the ED course. Hospitalization was considered for further testing, treatments, or serial exams/observation. However as patient is well-appearing, has a stable exam, and reassuring studies today, I do not feel that they warrant admission at this time. This plan was discussed with the patient who verbalizes agreement and comfort with this plan and seems reliable and able to return to the Emergency Department with worsening or changing symptoms.          Final Clinical Impression(s) / ED Diagnoses Final diagnoses:  Open displaced fracture of distal phalanx of lesser toe of left foot, initial encounter  Laceration of nail bed of toe, initial encounter  Laceration of lesser toe of left foot without foreign body with damage to nail, initial encounter    Rx / DC Orders ED Discharge Orders     None         , PA-C 04/14/22 2235    Tegeler, 2236, MD 04/14/22 [phone]

## 2022-04-14 NOTE — ED Notes (Signed)
Patient is being discharged from the Urgent Care and sent to the Emergency Department via POV . Per Salli Quarry NP, patient is in need of higher level of care due to open wound and broken toe. Patient is aware and verbalizes understanding of plan of care.  Vitals:   04/14/22 1139  BP: (!) 150/93  Pulse: 81  Resp: 18  Temp: 98.3 F (36.8 C)  SpO2: 97%

## 2022-04-14 NOTE — ED Provider Notes (Addendum)
Patient presents for evaluation to injury to the third left toe beginning today within the last 2 hours after a pallet of wood fell onto the foot.  Site is currently bleeding.  Laceration is present to the left third toe with avulsion of the nail, Comminuted fracture confirmed by x-ray and therefore patient has been sent to the nearest emergency department to be escorted by daughter for management of open wound fracture, wound has been irrigated in urgent care with saline covered with a nonadherent dressing and gauze for transport.   Valinda Hoar, NP 04/14/22 1241    Valinda Hoar, NP 04/14/22 1241

## 2022-04-14 NOTE — ED Provider Triage Note (Signed)
Emergency Medicine Provider Triage Evaluation Note  Nicholas Johnston , a 66 y.o. male  was evaluated in triage.  Sent from urgent care due to left foot injury.  Was working and had a wood slack fall onto his left toe.  Bleeding controlled however laceration involves nailbed.  Review of Systems  Positive:  Negative:   Physical Exam  BP (!) 159/95 (BP Location: Right Arm)   Pulse 73   Temp 97.9 F (36.6 C) (Oral)   Resp 16   Ht 5\' 5"  (1.651 m)   Wt 71.7 kg   SpO2 99%   BMI 26.29 kg/m  Gen:   Awake, no distress   Resp:  Normal effort  MSK:   Moves extremities without difficulty  Other:  Part of the distal third left toe is missing.  Some lateral nailbed involvement.  Bleeding controlled, sensation intact, normal DP pulse  Medical Decision Making  Medically screening exam initiated at 2:37 PM.  Appropriate orders placed.  Nicholas Johnston was informed that the remainder of the evaluation will be completed by another provider, this initial triage assessment does not replace that evaluation, and the importance of remaining in the ED until their evaluation is complete.   Daughter used as Nicholas Johnston.  Will require repair.   Equities trader, PA-C 04/14/22 1439

## 2022-04-14 NOTE — ED Notes (Signed)
ED Provider at bedside. 

## 2022-04-14 NOTE — Discharge Instructions (Signed)
Please go to the nearest emergency department for further evaluation as your bone is broken and there is an open wound you will need initially some IV antibiotics to ensure that your bone does not become infected

## 2022-04-14 NOTE — ED Triage Notes (Signed)
Patiient injured left middle toe by dropping wood on it and has laceration and reports it was deemed broken by UC.

## 2022-04-14 NOTE — ED Notes (Signed)
ED Provider at bedside suturing patients toe.

## 2022-04-14 NOTE — ED Triage Notes (Signed)
Pt through his daughter states that pt dropped a piece of wood on his left third toe this morning.

## 2022-04-14 NOTE — Discharge Instructions (Addendum)
Please read and follow all provided instructions.  Your diagnoses today include:  1. Open displaced fracture of distal phalanx of lesser toe of left foot, initial encounter   2. Laceration of nail bed of toe, initial encounter   3. Laceration of lesser toe of left foot without foreign body with damage to nail, initial encounter     Tests performed today include: An x-ray of the affected area - shows a significantly broken toe Vital signs. See below for your results today.   Medications prescribed:  Keflex (cephalexin) - antibiotic  You have been prescribed an antibiotic medicine: take the entire course of medicine even if you are feeling better. Stopping early can cause the antibiotic not to work.  Vicodin (hydrocodone/acetaminophen) - narcotic pain medication  DO NOT drive or perform any activities that require you to be awake and alert because this medicine can make you drowsy. BE VERY CAREFUL not to take multiple medicines containing Tylenol (also called acetaminophen). Doing so can lead to an overdose which can damage your liver and cause liver failure and possibly death.  Take any prescribed medications only as directed.  Home care instructions:  Follow any educational materials contained in this packet Follow R.I.C.E. Protocol: R - rest your injury  I  - use ice on injury without applying directly to skin C - compress injury with bandage or splint E - elevate the injury as much as possible  Follow-up instructions: Please follow-up with the podiatrist listed for further evaluation and management of your injury.   Return instructions:  Please return if your toes or feet are numb or tingling, appear gray or blue, or you have severe pain (also elevate the leg and loosen splint or wrap if you were given one) Please return to the Emergency Department if you experience worsening symptoms.  Please return if you have any other emergent concerns.  Additional Information:  Your  vital signs today were: BP 113/72 (BP Location: Right Arm)   Pulse 74   Temp 98.9 F (37.2 C) (Oral)   Resp 18   Ht 5\' 5"  (1.651 m)   Wt 71.7 kg   SpO2 98%   BMI 26.29 kg/m  If your blood pressure (BP) was elevated above 135/85 this visit, please have this repeated by your doctor within one month. --------------

## 2022-04-17 ENCOUNTER — Ambulatory Visit: Payer: BC Managed Care – PPO | Admitting: Podiatry

## 2022-04-17 ENCOUNTER — Encounter: Payer: Self-pay | Admitting: Podiatry

## 2022-04-17 DIAGNOSIS — S92502A Displaced unspecified fracture of left lesser toe(s), initial encounter for closed fracture: Secondary | ICD-10-CM

## 2022-04-17 NOTE — Progress Notes (Signed)
   Chief Complaint  Patient presents with   Foot Problem    PATIENT BROKE TOE ON LEFT FOOT, 3RD DIGIT     HPI: 66 y.o. male presenting today for follow-up evaluation of an injury to the left third toe.  Presents today with her daughter for translation.  Patient states that on Tuesday, 04/14/2022, he was working in the yard when a piece of wood fell on his toe.  He presented to the emergency department for evaluation who discharged him home on oral antibiotics.  Patient is currently taking Keflex 500 mg QID.  He presents for further treatment and evaluation  No past medical history on file.  No past surgical history on file.  No Known Allergies      Physical Exam: General: The patient is alert and oriented x3 in no acute distress.  Dermatology: Open wound noted to the distal half of the left third toe.  Moderate edema.  No purulence or malodor.  Vascular: Palpable pedal pulses bilaterally. Capillary refill within normal limits.  Negative for any significant edema or erythema  Neurological: Light touch and protective threshold grossly intact  Musculoskeletal Exam: No pedal deformities noted  Radiographic Exam:  Normal osseous mineralization.  Crush injury of the distal phalanx of the left third toe noted without significant displacement  Assessment: 1.  Crush injury distal half of the left third toe   Plan of Care:  1. Patient evaluated. X-Rays reviewed.  2.  Recommend soaking his foot in warm water and Epsom salt soaks with Betadine.  Also recommended light antibacterial soap and water to wash daily.  Silvadene cream also provided for the patient to apply daily. 3.  Patient has a postsurgical shoe.  Continue 4.  Continue Keflex 500 mg  QID as prescribed 5. return to clinic in 1 week      Felecia Shelling, DPM [facility]  Dr. Felecia Shelling, DPM    [address] [place], [place] [address]                Office [phone]  Fax [phone]

## 2022-04-27 ENCOUNTER — Ambulatory Visit (INDEPENDENT_AMBULATORY_CARE_PROVIDER_SITE_OTHER): Payer: BC Managed Care – PPO

## 2022-04-27 ENCOUNTER — Ambulatory Visit: Payer: BC Managed Care – PPO | Admitting: Podiatry

## 2022-04-27 DIAGNOSIS — S92502A Displaced unspecified fracture of left lesser toe(s), initial encounter for closed fracture: Secondary | ICD-10-CM | POA: Diagnosis not present

## 2022-04-27 NOTE — Progress Notes (Signed)
   Chief Complaint  Patient presents with   Follow-up    Follow-up  for left foot broken middle toe.    HPI: 66 y.o. male presenting today for follow-up evaluation of an injury to the left third toe.  Presents today with her daughter for translation.  Patient states that on Tuesday, 04/14/2022, he was working in the yard when a piece of wood fell on his toe.  He presented to the emergency department for evaluation who discharged him home on oral antibiotics.  Patient has now completed the oral Keflex.  He says that the pain has subsided significantly over the past week.  He presents for follow-up treatment and evaluation  No past medical history on file.  No past surgical history on file.  No Known Allergies   Physical Exam: General: The patient is alert and oriented x3 in no acute distress.  Dermatology: Open wound noted to the distal half of the left third toe.  Moderate edema.  No purulence or malodor.  Vascular: Palpable pedal pulses bilaterally. Capillary refill within normal limits.  Negative for any significant edema or erythema  Neurological: Light touch and protective threshold grossly intact  Musculoskeletal Exam: No pedal deformities noted  Radiographic Exam 04/27/2022:  Normal osseous mineralization.  Crush injury of the distal phalanx of the left third toe noted without significant displacement  Assessment: 1.  Crush injury distal half of the left third toe   Plan of Care:  1. Patient evaluated.  2.  Overall there is significant improvement.  Continue soaking in warm water with Betadine and washing the foot 2 times daily. 3.  Continue Silvadene cream with a light dressing daily 4.  Continue the postsurgical shoe 5.  Return to clinic 1 week      Felecia Shelling, DPM [facility]  Dr. Felecia Shelling, DPM    [address] [place], [place] [address]                Office [phone]  Fax [phone]

## 2022-05-04 ENCOUNTER — Ambulatory Visit (INDEPENDENT_AMBULATORY_CARE_PROVIDER_SITE_OTHER): Payer: BC Managed Care – PPO | Admitting: Podiatry

## 2022-05-04 ENCOUNTER — Encounter: Payer: Self-pay | Admitting: Podiatry

## 2022-05-04 DIAGNOSIS — S92502A Displaced unspecified fracture of left lesser toe(s), initial encounter for closed fracture: Secondary | ICD-10-CM | POA: Diagnosis not present

## 2022-05-04 NOTE — Progress Notes (Signed)
   Chief Complaint  Patient presents with   Foot Problem    Left foot third toe broken.patient is here for follow-up.    HPI: 66 y.o. male presenting today for follow-up evaluation of an injury to the left third toe.  Presents today with her daughter for translation.  Patient states that on Tuesday, 04/14/2022, he was working in the yard when a piece of wood fell on his toe.    Overall the patient is improving.  He says the pain is decreasing.  He has been soaking his foot in Betadine warm water soaks and applying Betadine ointment that was provided here in the office.  He continues to wear the surgical shoe.  No past medical history on file.  No past surgical history on file.  No Known Allergies    Physical Exam: General: The patient is alert and oriented x3 in no acute distress.  Dermatology: Open wound noted to the distal half of the left third toe.  Please see above noted picture.  There is some intermixed granulation and fibrotic tissue noted.  There is no exposed bone muscle tendon ligament or joint.  Moderate edema.  No purulence or malodor.  Vascular: Palpable pedal pulses bilaterally. Capillary refill within normal limits.  Negative for any significant edema or erythema  Neurological: Light touch and protective threshold grossly intact  Musculoskeletal Exam: No pedal deformities noted  Radiographic Exam 04/27/2022:  Normal osseous mineralization.  Crush injury of the distal phalanx of the left third toe noted without significant displacement  Assessment: 1.  Crush injury distal half of the left third toe   Plan of Care:  1. Patient evaluated.  Suture removed.  When the sutures removed the nail plate also came off. 2.  Overall there is significant improvement.  Continue soaking in warm water with Betadine and washing the foot 2 times daily. 3.  Continue Betadine ointment daily 4.  Continue weightbearing in the postsurgical shoe  5.  Continue to refrain from work.   Additional note was provided  6.  Return to clinic 2 weeks      Edrick Kins, DPM [facility]  Dr. Edrick Kins, DPM    [address] [place], [place] [address]                Office [phone]  Fax [phone]

## 2022-05-18 ENCOUNTER — Encounter: Payer: Self-pay | Admitting: Podiatry

## 2022-05-18 ENCOUNTER — Ambulatory Visit: Payer: BC Managed Care – PPO | Admitting: Podiatry

## 2022-05-18 DIAGNOSIS — S92502A Displaced unspecified fracture of left lesser toe(s), initial encounter for closed fracture: Secondary | ICD-10-CM

## 2022-05-18 NOTE — Progress Notes (Signed)
   Chief Complaint  Patient presents with   2 Week Follow-up    Patient is here for 2 week follow-up for left foot third toe, patient states that the toe hurts sometimes, he is taking pain medicine when it hurts.    HPI: 66 y.o. male presenting today for follow-up evaluation of an injury to the left third toe.  Presents today with her daughter for translation.  Patient states that on Tuesday, 04/14/2022, he was working in the yard when a piece of wood fell on his toe.    Patient continues to improve.  He soaks the foot in warm water and Betadine and applies the Betadine ointment that was provided.  Doing well with no new complaints at this time  No past medical history on file.  No past surgical history on file.  No Known Allergies   Physical Exam: General: The patient is alert and oriented x3 in no acute distress.  Dermatology: Open wound noted to the distal half of the left third toe.  Please see above noted picture.  There is some intermixed granulation and fibrotic tissue noted.  There is no exposed bone muscle tendon ligament or joint.  Moderate edema.  No purulence or malodor.  Vascular: Palpable pedal pulses bilaterally. Capillary refill within normal limits.  Negative for any significant edema or erythema  Neurological: Light touch and protective threshold grossly intact  Musculoskeletal Exam: No pedal deformities noted  Radiographic Exam 04/27/2022:  Normal osseous mineralization.  Crush injury of the distal phalanx of the left third toe noted without significant displacement  Assessment: 1.  Crush injury distal half of the left third toe   Plan of Care:  1. Patient evaluated.   2.  Overall there continues to be steady improvement.  We will continue with conservative treatment including Betadine topical daily with the postsurgical shoe 3.  Return to clinic in 3 weeks for follow-up x-ray 4.  In the meantime continue the postsurgical shoe.  Patient states that he has  tried wearing regular work shoes but he has increased pain and tenderness to the toe. 5.  Refrain from work an additional 3 weeks until reevaluation.  X-rays next visit     Edrick Kins, DPM [facility]  Dr. Edrick Kins, DPM    [address] [place], [place] [address]                Office [phone]  Fax [phone]

## 2022-05-19 ENCOUNTER — Encounter: Payer: Self-pay | Admitting: Podiatry

## 2022-06-08 ENCOUNTER — Ambulatory Visit (INDEPENDENT_AMBULATORY_CARE_PROVIDER_SITE_OTHER): Payer: BC Managed Care – PPO

## 2022-06-08 ENCOUNTER — Ambulatory Visit: Payer: BC Managed Care – PPO | Admitting: Podiatry

## 2022-06-08 DIAGNOSIS — S92502A Displaced unspecified fracture of left lesser toe(s), initial encounter for closed fracture: Secondary | ICD-10-CM | POA: Diagnosis not present

## 2022-06-08 NOTE — Progress Notes (Signed)
   Chief Complaint  Patient presents with   2 Week Follow-up    Patient is here for follow-up left foot third toe fracture.    HPI: 66 y.o. male presenting today for follow-up evaluation of an injury to the left third toe.  Presents today with her daughter for translation.  Patient states that on Tuesday, 04/14/2022, he was working in the yard when a piece of wood fell on his toe.   Patient states that over the past few weeks the pain has resolved.  He is actually feeling very well and he believes the toe wound has healed.  No past medical history on file.  No past surgical history on file.  No Known Allergies   Physical Exam: General: The patient is alert and oriented x3 in no acute distress.  Dermatology: The open wound to the distal half of the left third toe appears completely resolved.  Healthy underlying tissue noted.  No open wound noted.  Vascular: Palpable pedal pulses bilaterally. Capillary refill within normal limits.  Negative for any significant edema or erythema  Neurological: Light touch and protective threshold grossly intact  Musculoskeletal Exam: No pedal deformities noted  Radiographic Exam 06/08/2022:  Normal osseous mineralization.  Crush injury of the distal phalanx of the left third toe.  Fragmentation noted distal phalanx.  Injured area appears stable  Assessment: 1.  Crush injury distal half of the left third toe   Plan of Care:  1. Patient evaluated.   2.  After debridement of the superficial callus today there was healthy underlying skin.  The ulcer now up to the distal tip of the toe has resolved and healed completely.  There continues to be chronic edema to the distal tip of the toe but there is no erythema or concern clinically for infection 3.  Patient may resume tennis shoes.  Patient planning to return to work tomorrow.  Patient may return to work full activity no restrictions 4.  Return to clinic as needed   Edrick Kins, DPM [facility]  Dr. Edrick Kins, DPM    [address], [place] [address]                Office [phone]  Fax [phone]

## 2022-09-13 DIAGNOSIS — Z125 Encounter for screening for malignant neoplasm of prostate: Secondary | ICD-10-CM | POA: Diagnosis not present

## 2022-09-13 DIAGNOSIS — E559 Vitamin D deficiency, unspecified: Secondary | ICD-10-CM | POA: Diagnosis not present

## 2022-09-13 DIAGNOSIS — E1165 Type 2 diabetes mellitus with hyperglycemia: Secondary | ICD-10-CM | POA: Diagnosis not present

## 2022-09-13 DIAGNOSIS — I10 Essential (primary) hypertension: Secondary | ICD-10-CM | POA: Diagnosis not present

## 2022-09-13 DIAGNOSIS — E039 Hypothyroidism, unspecified: Secondary | ICD-10-CM | POA: Diagnosis not present

## 2022-09-13 DIAGNOSIS — E782 Mixed hyperlipidemia: Secondary | ICD-10-CM | POA: Diagnosis not present

## 2022-09-16 DIAGNOSIS — E039 Hypothyroidism, unspecified: Secondary | ICD-10-CM | POA: Diagnosis not present

## 2022-09-16 DIAGNOSIS — E1165 Type 2 diabetes mellitus with hyperglycemia: Secondary | ICD-10-CM | POA: Diagnosis not present

## 2022-09-16 DIAGNOSIS — I10 Essential (primary) hypertension: Secondary | ICD-10-CM | POA: Diagnosis not present

## 2022-09-16 DIAGNOSIS — E559 Vitamin D deficiency, unspecified: Secondary | ICD-10-CM | POA: Diagnosis not present

## 2022-09-16 DIAGNOSIS — Z125 Encounter for screening for malignant neoplasm of prostate: Secondary | ICD-10-CM | POA: Diagnosis not present

## 2022-09-26 DIAGNOSIS — E1165 Type 2 diabetes mellitus with hyperglycemia: Secondary | ICD-10-CM | POA: Diagnosis not present

## 2022-09-26 DIAGNOSIS — E039 Hypothyroidism, unspecified: Secondary | ICD-10-CM | POA: Diagnosis not present

## 2022-09-26 DIAGNOSIS — E782 Mixed hyperlipidemia: Secondary | ICD-10-CM | POA: Diagnosis not present

## 2022-09-26 DIAGNOSIS — E559 Vitamin D deficiency, unspecified: Secondary | ICD-10-CM | POA: Diagnosis not present

## 2022-10-26 ENCOUNTER — Telehealth: Payer: Self-pay

## 2022-10-26 NOTE — Telephone Encounter (Signed)
Mychart msg sent

## 2023-01-02 DIAGNOSIS — E1165 Type 2 diabetes mellitus with hyperglycemia: Secondary | ICD-10-CM | POA: Diagnosis not present

## 2023-01-02 DIAGNOSIS — E559 Vitamin D deficiency, unspecified: Secondary | ICD-10-CM | POA: Diagnosis not present

## 2023-01-02 DIAGNOSIS — E039 Hypothyroidism, unspecified: Secondary | ICD-10-CM | POA: Diagnosis not present

## 2023-01-02 DIAGNOSIS — E782 Mixed hyperlipidemia: Secondary | ICD-10-CM | POA: Diagnosis not present

## 2023-01-02 LAB — PROTEIN / CREATININE RATIO, URINE
Albumin, U: 0.7
Creatinine, Urine: 72

## 2023-01-02 LAB — MICROALBUMIN / CREATININE URINE RATIO: Microalb Creat Ratio: 10

## 2023-01-16 DIAGNOSIS — E1165 Type 2 diabetes mellitus with hyperglycemia: Secondary | ICD-10-CM | POA: Diagnosis not present

## 2023-01-16 DIAGNOSIS — E782 Mixed hyperlipidemia: Secondary | ICD-10-CM | POA: Diagnosis not present

## 2023-02-27 DIAGNOSIS — E039 Hypothyroidism, unspecified: Secondary | ICD-10-CM | POA: Diagnosis not present

## 2023-02-27 DIAGNOSIS — E1165 Type 2 diabetes mellitus with hyperglycemia: Secondary | ICD-10-CM | POA: Diagnosis not present

## 2023-02-27 DIAGNOSIS — E559 Vitamin D deficiency, unspecified: Secondary | ICD-10-CM | POA: Diagnosis not present

## 2023-02-27 DIAGNOSIS — E782 Mixed hyperlipidemia: Secondary | ICD-10-CM | POA: Diagnosis not present

## 2023-03-03 DIAGNOSIS — H35371 Puckering of macula, right eye: Secondary | ICD-10-CM | POA: Diagnosis not present

## 2023-03-22 DIAGNOSIS — H2511 Age-related nuclear cataract, right eye: Secondary | ICD-10-CM | POA: Diagnosis not present

## 2023-03-22 DIAGNOSIS — H25043 Posterior subcapsular polar age-related cataract, bilateral: Secondary | ICD-10-CM | POA: Diagnosis not present

## 2023-03-22 DIAGNOSIS — H25013 Cortical age-related cataract, bilateral: Secondary | ICD-10-CM | POA: Diagnosis not present

## 2023-03-22 DIAGNOSIS — H35371 Puckering of macula, right eye: Secondary | ICD-10-CM | POA: Diagnosis not present

## 2023-03-22 DIAGNOSIS — H2513 Age-related nuclear cataract, bilateral: Secondary | ICD-10-CM | POA: Diagnosis not present

## 2023-03-31 DIAGNOSIS — E1165 Type 2 diabetes mellitus with hyperglycemia: Secondary | ICD-10-CM | POA: Diagnosis not present

## 2023-03-31 DIAGNOSIS — E782 Mixed hyperlipidemia: Secondary | ICD-10-CM | POA: Diagnosis not present

## 2023-03-31 DIAGNOSIS — E039 Hypothyroidism, unspecified: Secondary | ICD-10-CM | POA: Diagnosis not present

## 2023-03-31 DIAGNOSIS — E559 Vitamin D deficiency, unspecified: Secondary | ICD-10-CM | POA: Diagnosis not present

## 2023-05-17 ENCOUNTER — Other Ambulatory Visit: Payer: Self-pay

## 2023-05-17 ENCOUNTER — Emergency Department (HOSPITAL_COMMUNITY): Payer: BC Managed Care – PPO

## 2023-05-17 ENCOUNTER — Inpatient Hospital Stay (HOSPITAL_COMMUNITY)
Admission: EM | Admit: 2023-05-17 | Discharge: 2023-05-20 | DRG: 872 | Disposition: A | Discharge status: Home / Self Care | Payer: BC Managed Care – PPO | Attending: Internal Medicine | Admitting: Internal Medicine

## 2023-05-17 ENCOUNTER — Encounter (HOSPITAL_COMMUNITY): Payer: Self-pay

## 2023-05-17 DIAGNOSIS — E871 Hypo-osmolality and hyponatremia: Secondary | ICD-10-CM | POA: Diagnosis present

## 2023-05-17 DIAGNOSIS — E1165 Type 2 diabetes mellitus with hyperglycemia: Secondary | ICD-10-CM | POA: Diagnosis present

## 2023-05-17 DIAGNOSIS — A419 Sepsis, unspecified organism: Principal | ICD-10-CM | POA: Diagnosis present

## 2023-05-17 DIAGNOSIS — N12 Tubulo-interstitial nephritis, not specified as acute or chronic: Secondary | ICD-10-CM | POA: Diagnosis not present

## 2023-05-17 DIAGNOSIS — E785 Hyperlipidemia, unspecified: Secondary | ICD-10-CM | POA: Diagnosis present

## 2023-05-17 DIAGNOSIS — R9431 Abnormal electrocardiogram [ECG] [EKG]: Secondary | ICD-10-CM | POA: Diagnosis not present

## 2023-05-17 DIAGNOSIS — Z79899 Other long term (current) drug therapy: Secondary | ICD-10-CM

## 2023-05-17 DIAGNOSIS — Z79890 Hormone replacement therapy (postmenopausal): Secondary | ICD-10-CM

## 2023-05-17 DIAGNOSIS — N10 Acute tubulo-interstitial nephritis: Secondary | ICD-10-CM

## 2023-05-17 DIAGNOSIS — R509 Fever, unspecified: Secondary | ICD-10-CM | POA: Diagnosis not present

## 2023-05-17 DIAGNOSIS — D649 Anemia, unspecified: Secondary | ICD-10-CM | POA: Diagnosis present

## 2023-05-17 DIAGNOSIS — N179 Acute kidney failure, unspecified: Secondary | ICD-10-CM

## 2023-05-17 DIAGNOSIS — E039 Hypothyroidism, unspecified: Secondary | ICD-10-CM | POA: Diagnosis not present

## 2023-05-17 DIAGNOSIS — E876 Hypokalemia: Secondary | ICD-10-CM | POA: Diagnosis not present

## 2023-05-17 DIAGNOSIS — E1122 Type 2 diabetes mellitus with diabetic chronic kidney disease: Secondary | ICD-10-CM

## 2023-05-17 DIAGNOSIS — Z603 Acculturation difficulty: Secondary | ICD-10-CM | POA: Diagnosis present

## 2023-05-17 DIAGNOSIS — I10 Essential (primary) hypertension: Secondary | ICD-10-CM | POA: Diagnosis not present

## 2023-05-17 DIAGNOSIS — R109 Unspecified abdominal pain: Secondary | ICD-10-CM | POA: Diagnosis not present

## 2023-05-17 DIAGNOSIS — Z7984 Long term (current) use of oral hypoglycemic drugs: Secondary | ICD-10-CM

## 2023-05-17 HISTORY — DX: Type 2 diabetes mellitus without complications: E11.9

## 2023-05-17 HISTORY — DX: Hyperlipidemia, unspecified: E78.5

## 2023-05-17 HISTORY — DX: Acute pyelonephritis: N10

## 2023-05-17 LAB — URINALYSIS, ROUTINE W REFLEX MICROSCOPIC
Bilirubin Urine: NEGATIVE
Glucose, UA: >=500 mg/dL — AB
Ketones, ur: 5 mg/dL — AB
Leukocytes,Ua: NEGATIVE
Nitrite: NEGATIVE
Protein, ur: 100 mg/dL — AB
Specific Gravity, Urine: 1.025 (ref 1.005–1.030)
pH: 5 (ref 5.0–8.0)

## 2023-05-17 LAB — COMPREHENSIVE METABOLIC PANEL
ALT: 28 U/L (ref 0–44)
AST: 15 U/L (ref 15–41)
Albumin: 3 g/dL — ABNORMAL LOW (ref 3.5–5.0)
Alkaline Phosphatase: 84 U/L (ref 38–126)
Anion gap: 10 (ref 5–15)
BUN: 20 mg/dL (ref 8–23)
CO2: 26 mmol/L (ref 22–32)
Calcium: 8.5 mg/dL — ABNORMAL LOW (ref 8.9–10.3)
Chloride: 97 mmol/L — ABNORMAL LOW (ref 98–111)
Creatinine, Ser: 1.82 mg/dL — ABNORMAL HIGH (ref 0.61–1.24)
GFR, Estimated: 41 mL/min — ABNORMAL LOW (ref >60)
Glucose, Bld: 388 mg/dL — ABNORMAL HIGH (ref 70–99)
Potassium: 4.4 mmol/L (ref 3.5–5.1)
Sodium: 133 mmol/L — ABNORMAL LOW (ref 135–145)
Total Bilirubin: 0.7 mg/dL (ref 0.3–1.2)
Total Protein: 7 g/dL (ref 6.5–8.1)

## 2023-05-17 LAB — HEMOGLOBIN A1C
Hgb A1c MFr Bld: 11.1 % — ABNORMAL HIGH (ref 4.8–5.6)
Mean Plasma Glucose: 271.87 mg/dL

## 2023-05-17 LAB — BASIC METABOLIC PANEL
Anion gap: 14 (ref 5–15)
BUN: 19 mg/dL (ref 8–23)
CO2: 23 mmol/L (ref 22–32)
Calcium: 8.5 mg/dL — ABNORMAL LOW (ref 8.9–10.3)
Chloride: 97 mmol/L — ABNORMAL LOW (ref 98–111)
Creatinine, Ser: 1.51 mg/dL — ABNORMAL HIGH (ref 0.61–1.24)
GFR, Estimated: 51 mL/min — ABNORMAL LOW (ref >60)
Glucose, Bld: 411 mg/dL — ABNORMAL HIGH (ref 70–99)
Potassium: 4.1 mmol/L (ref 3.5–5.1)
Sodium: 134 mmol/L — ABNORMAL LOW (ref 135–145)

## 2023-05-17 LAB — CBC
HCT: 40 % (ref 39.0–52.0)
HCT: 38.7 % — ABNORMAL LOW (ref 39.0–52.0)
Hemoglobin: 12.8 g/dL — ABNORMAL LOW (ref 13.0–17.0)
Hemoglobin: 12.5 g/dL — ABNORMAL LOW (ref 13.0–17.0)
MCH: 26.9 pg (ref 26.0–34.0)
MCH: 26.9 pg (ref 26.0–34.0)
MCHC: 32 g/dL (ref 30.0–36.0)
MCHC: 32.3 g/dL (ref 30.0–36.0)
MCV: 84.2 fL (ref 80.0–100.0)
MCV: 83.4 fL (ref 80.0–100.0)
Platelets: 197 10*3/uL (ref 150–400)
Platelets: 218 10*3/uL (ref 150–400)
RBC: 4.75 MIL/uL (ref 4.22–5.81)
RBC: 4.64 MIL/uL (ref 4.22–5.81)
RDW: 12.8 % (ref 11.5–15.5)
RDW: 13.1 % (ref 11.5–15.5)
WBC: 16.5 10*3/uL — ABNORMAL HIGH (ref 4.0–10.5)
WBC: 15.4 10*3/uL — ABNORMAL HIGH (ref 4.0–10.5)
nRBC: 0 % (ref 0.0–0.2)
nRBC: 0 % (ref 0.0–0.2)

## 2023-05-17 LAB — URINALYSIS, W/ REFLEX TO CULTURE (INFECTION SUSPECTED)
Bacteria, UA: NONE SEEN
Bilirubin Urine: NEGATIVE
Glucose, UA: >=500 mg/dL — AB
Ketones, ur: 5 mg/dL — AB
Leukocytes,Ua: NEGATIVE
Nitrite: NEGATIVE
Protein, ur: 30 mg/dL — AB
Specific Gravity, Urine: 1.027 (ref 1.005–1.030)
pH: 5 (ref 5.0–8.0)

## 2023-05-17 LAB — LIPASE, BLOOD: Lipase: 23 U/L (ref 11–51)

## 2023-05-17 LAB — GLUCOSE, CAPILLARY
Glucose-Capillary: 256 mg/dL — ABNORMAL HIGH (ref 70–99)
Glucose-Capillary: 406 mg/dL — ABNORMAL HIGH (ref 70–99)

## 2023-05-17 LAB — HIV ANTIBODY (ROUTINE TESTING W REFLEX): HIV Screen 4th Generation wRfx: NONREACTIVE

## 2023-05-17 LAB — LIPID PANEL
Cholesterol: 97 mg/dL (ref 0–200)
HDL: 23 mg/dL — ABNORMAL LOW (ref >40)
LDL Cholesterol: 49 mg/dL (ref 0–99)
Total CHOL/HDL Ratio: 4.2 {ratio}
Triglycerides: 123 mg/dL (ref <150)
VLDL: 25 mg/dL (ref 0–40)

## 2023-05-17 LAB — TSH: TSH: 1.929 u[IU]/mL (ref 0.350–4.500)

## 2023-05-17 MED ORDER — ONDANSETRON HCL 4 MG PO TABS: 4.0000 mg | ORAL_TABLET | Freq: Four times a day (QID) | ORAL | Status: DC | PRN

## 2023-05-17 MED ORDER — INSULIN GLARGINE-YFGN 100 UNIT/ML ~~LOC~~ SOLN
5.0000 [IU] | Freq: Every day | SUBCUTANEOUS | Status: DC
  Administered 2023-05-17 – 2023-05-18 (×2): 5 [IU] via SUBCUTANEOUS
  Filled 2023-05-17 (×3): qty 0.05

## 2023-05-17 MED ORDER — INSULIN ASPART 100 UNIT/ML IJ SOLN
5.0000 [IU] | Freq: Once | INTRAMUSCULAR | Status: AC
  Administered 2023-05-17: 5 [IU] via SUBCUTANEOUS

## 2023-05-17 MED ORDER — SODIUM CHLORIDE 0.9 % IV BOLUS
1000.0000 mL | Freq: Once | INTRAVENOUS | Status: AC
  Administered 2023-05-17: 1000 mL via INTRAVENOUS

## 2023-05-17 MED ORDER — INSULIN ASPART 100 UNIT/ML IJ SOLN
0.0000 [IU] | Freq: Three times a day (TID) | INTRAMUSCULAR | Status: DC
  Administered 2023-05-18: 5 [IU] via SUBCUTANEOUS
  Administered 2023-05-18: 7 [IU] via SUBCUTANEOUS
  Administered 2023-05-18: 3 [IU] via SUBCUTANEOUS
  Administered 2023-05-19: 1 [IU] via SUBCUTANEOUS
  Administered 2023-05-19: 2 [IU] via SUBCUTANEOUS
  Administered 2023-05-19: 7 [IU] via SUBCUTANEOUS
  Administered 2023-05-20: 2 [IU] via SUBCUTANEOUS

## 2023-05-17 MED ORDER — ATORVASTATIN CALCIUM 40 MG PO TABS
40.0000 mg | ORAL_TABLET | Freq: Every day | ORAL | Status: DC
  Administered 2023-05-17 – 2023-05-20 (×4): 40 mg via ORAL
  Filled 2023-05-17 (×4): qty 1

## 2023-05-17 MED ORDER — SODIUM CHLORIDE 0.9 % IV SOLN
1.0000 g | Freq: Once | INTRAVENOUS | Status: AC
  Administered 2023-05-17: 1 g via INTRAVENOUS
  Filled 2023-05-17: qty 10

## 2023-05-17 MED ORDER — ONDANSETRON HCL 4 MG/2ML IJ SOLN: 4.0000 mg | Freq: Four times a day (QID) | INTRAMUSCULAR | Status: DC | PRN

## 2023-05-17 MED ORDER — SODIUM CHLORIDE 0.9 % IV SOLN
2.0000 g | INTRAVENOUS | Status: DC
  Administered 2023-05-18 – 2023-05-20 (×3): 2 g via INTRAVENOUS
  Filled 2023-05-17 (×3): qty 20

## 2023-05-17 MED ORDER — INSULIN ASPART 100 UNIT/ML IJ SOLN: 0.0000 [IU] | Freq: Three times a day (TID) | INTRAMUSCULAR | Status: DC

## 2023-05-17 MED ORDER — SODIUM CHLORIDE 0.9 % IV SOLN: INTRAVENOUS | Status: AC

## 2023-05-17 MED ORDER — INSULIN ASPART 100 UNIT/ML IJ SOLN
0.0000 [IU] | Freq: Every day | INTRAMUSCULAR | Status: DC
  Administered 2023-05-18: 3 [IU] via SUBCUTANEOUS
  Administered 2023-05-19: 2 [IU] via SUBCUTANEOUS

## 2023-05-17 MED ORDER — SODIUM CHLORIDE 0.9 % IV BOLUS
500.0000 mL | Freq: Once | INTRAVENOUS | Status: AC
  Administered 2023-05-17: 500 mL via INTRAVENOUS

## 2023-05-17 MED ORDER — IOHEXOL 350 MG/ML SOLN
60.0000 mL | Freq: Once | INTRAVENOUS | Status: AC | PRN
  Administered 2023-05-17: 60 mL via INTRAVENOUS

## 2023-05-17 MED ORDER — FENTANYL CITRATE PF 50 MCG/ML IJ SOSY
50.0000 ug | PREFILLED_SYRINGE | Freq: Once | INTRAMUSCULAR | Status: AC
  Administered 2023-05-17: 50 ug via INTRAVENOUS
  Filled 2023-05-17: qty 1

## 2023-05-17 MED ORDER — ACETAMINOPHEN 650 MG RE SUPP: 650.0000 mg | Freq: Four times a day (QID) | RECTAL | Status: DC | PRN

## 2023-05-17 MED ORDER — HEPARIN SODIUM (PORCINE) 5000 UNIT/ML IJ SOLN
5000.0000 [IU] | Freq: Three times a day (TID) | INTRAMUSCULAR | Status: DC
  Administered 2023-05-17 – 2023-05-20 (×8): 5000 [IU] via SUBCUTANEOUS
  Filled 2023-05-17 (×8): qty 1

## 2023-05-17 MED ORDER — ACETAMINOPHEN 325 MG PO TABS
650.0000 mg | ORAL_TABLET | Freq: Four times a day (QID) | ORAL | Status: DC | PRN
  Administered 2023-05-17 – 2023-05-19 (×4): 650 mg via ORAL
  Filled 2023-05-17 (×4): qty 2

## 2023-05-17 MED ORDER — ALBUTEROL SULFATE (2.5 MG/3ML) 0.083% IN NEBU: 2.5000 mg | INHALATION_SOLUTION | RESPIRATORY_TRACT | Status: DC | PRN

## 2023-05-17 NOTE — ED Notes (Signed)
ED TO INPATIENT HANDOFF REPORT  ED Nurse Name and Phone #: Camillia Herter Name/Age/Gender Nicholas Johnston 67 y.o. male Room/Bed: 028C/028C  Code Status   Code Status: Full Code  Home/SNF/Other Home Patient oriented to: self, place, time, and situation Is this baseline? Yes   Triage Complete: Triage complete  Chief Complaint Acute pyelonephritis [N10]  Triage Note Pt c/o dizziness, HA and pain in neck, right flank painx3d. Pt denies N/V, SOB   Allergies No Known Allergies  Level of Care/Admitting Diagnosis ED Disposition     ED Disposition  Admit   Condition  --   Comment  Hospital Area: [facility] [[id number]]  Level of Care: Telemetry Medical [104]  May admit patient to [facility] or [facility] if equivalent level of care is available:: No  Covid Evaluation: Asymptomatic - no recent exposure (last 10 days) testing not required  Diagnosis: Acute pyelonephritis [161096]  Admitting Physician: Lurline Del [[id number]]  Attending Physician: Lurline Del [[id number]]  Certification:: I certify this patient will need inpatient services for at least 2 midnights  Expected Medical Readiness: 05/19/2023          B Medical/Surgery History Past Medical History:  Diagnosis Date   Diabetes mellitus without complication (HCC)    History reviewed. No pertinent surgical history.   A IV Location/Drains/Wounds Patient Lines/Drains/Airways Status     Active Line/Drains/Airways     Name Placement date Placement time Site Days   Peripheral IV 05/17/23 20 G 1" Anterior;Left Forearm 05/17/23  1204  Forearm  less than 1            Intake/Output Last 24 hours No intake or output data in the 24 hours ending 05/17/23 1913  Labs/Imaging Results for orders placed or performed during the hospital encounter of 05/17/23 (from the past 48 hour(s))  Lipase, blood     Status: None   Collection Time: 05/17/23 10:15 AM  Result Value Ref Range   Lipase  23 11 - 51 U/L    Comment: Performed at [facility], [address], [place], [place] [address]  Comprehensive metabolic panel     Status: Abnormal   Collection Time: 05/17/23 10:15 AM  Result Value Ref Range   Sodium 133 (L) 135 - 145 mmol/L   Potassium 4.4 3.5 - 5.1 mmol/L   Chloride 97 (L) 98 - 111 mmol/L   CO2 26 22 - 32 mmol/L   Glucose, Bld 388 (H) 70 - 99 mg/dL    Comment: Glucose reference range applies only to samples taken after fasting for at least 8 hours.   BUN 20 8 - 23 mg/dL   Creatinine, Ser 1.30 (H) 0.61 - 1.24 mg/dL   Calcium 8.5 (L) 8.9 - 10.3 mg/dL   Total Protein 7.0 6.5 - 8.1 g/dL   Albumin 3.0 (L) 3.5 - 5.0 g/dL   AST 15 15 - 41 U/L   ALT 28 0 - 44 U/L   Alkaline Phosphatase 84 38 - 126 U/L   Total Bilirubin 0.7 0.3 - 1.2 mg/dL   GFR, Estimated 41 (L) >60 mL/min    Comment: (NOTE) Calculated using the CKD-EPI Creatinine Equation (2021)    Anion gap 10 5 - 15    Comment: Performed at [facility], [address], [place], [place] [address]  CBC     Status: Abnormal   Collection Time: 05/17/23 10:15 AM  Result Value Ref Range   WBC 16.5 (H)  4.0 - 10.5 K/uL   RBC 4.75 4.22 - 5.81 MIL/uL   Hemoglobin 12.8 (L) 13.0 - 17.0 g/dL   HCT 57.8 46.9 - 62.9 %   MCV 84.2 80.0 - 100.0 fL   MCH 26.9 26.0 - 34.0 pg   MCHC 32.0 30.0 - 36.0 g/dL   RDW 52.8 41.3 - 24.4 %   Platelets 197 150 - 400 K/uL   nRBC 0.0 0.0 - 0.2 %    Comment: Performed at [facility], [address] [place], [place] [address]  Urinalysis, Routine w reflex microscopic -Urine, Clean Catch     Status: Abnormal   Collection Time: 05/17/23 10:34 AM  Result Value Ref Range   Color, Urine YELLOW YELLOW   APPearance HAZY (A) CLEAR   Specific Gravity, Urine 1.025 1.005 - 1.030   pH 5.0 5.0 - 8.0   Glucose, UA >=500 (A) NEGATIVE mg/dL   Hgb urine dipstick MODERATE (A) NEGATIVE   Bilirubin Urine NEGATIVE NEGATIVE   Ketones, ur 5 (A) NEGATIVE mg/dL   Protein, ur 253 (A)  NEGATIVE mg/dL   Nitrite NEGATIVE NEGATIVE   Leukocytes,Ua NEGATIVE NEGATIVE   RBC / HPF 11-20 0 - 5 RBC/hpf   WBC, UA 21-50 0 - 5 WBC/hpf   Bacteria, UA RARE (A) NONE SEEN   Squamous Epithelial / HPF 0-5 0 - 5 /HPF   Mucus PRESENT    Non Squamous Epithelial 0-5 (A) NONE SEEN    Comment: Performed at [facility], [address], [place], [place] [address]   CT ABDOMEN PELVIS W CONTRAST  Result Date: 05/17/2023 CLINICAL DATA:  Bilateral lower quadrant pain and right flank pain for 3 days. Dizziness. EXAM: CT ABDOMEN AND PELVIS WITH CONTRAST TECHNIQUE: Multidetector CT imaging of the abdomen and pelvis was performed using the standard protocol following bolus administration of intravenous contrast. RADIATION DOSE REDUCTION: This exam was performed according to the departmental dose-optimization program which includes automated exposure control, adjustment of the mA and/or kV according to patient size and/or use of iterative reconstruction technique. CONTRAST:  60mL OMNIPAQUE IOHEXOL 350 MG/ML SOLN COMPARISON:  None Available. FINDINGS: Lower Chest: No acute findings. Hepatobiliary: No suspicious hepatic masses identified. Gallbladder is unremarkable. No evidence of biliary ductal dilatation. Pancreas:  No mass or inflammatory changes. Spleen: Within normal limits in size and appearance. Adrenals/Urinary Tract: Multiple ill-defined areas of decreased parenchymal contrast enhancement are seen in both kidneys, consistent with pyelonephritis. No evidence of renal mass or abscess. No evidence of ureteral calculi or hydronephrosis. Unremarkable unopacified urinary bladder. Stomach/Bowel: No evidence of obstruction, inflammatory process or abnormal fluid collections. Normal appendix visualized. Vascular/Lymphatic: No pathologically enlarged lymph nodes. No acute vascular findings. Reproductive:  No mass or other significant abnormality. Other:  None. Musculoskeletal:  No suspicious bone lesions  identified. IMPRESSION: Bilateral pyelonephritis. No evidence of renal abscess or hydronephrosis. Electronically Signed   By: Danae Orleans M.D.   On: 05/17/2023 15:19    Pending Labs Unresulted Labs (From admission, onward)     Start     Ordered   05/18/23 0500  CBC  Tomorrow morning,   R        05/17/23 1904   05/18/23 0500  Comprehensive metabolic panel  Tomorrow morning,   R        05/17/23 1904   05/17/23 1905  Culture, blood (Routine X 2) w Reflex to ID Panel  BLOOD CULTURE X 2,   R (with TIMED occurrences)  05/17/23 1904   05/17/23 1904  Urinalysis, w/ Reflex to Culture (Infection Suspected) -Urine, Clean Catch  (Urine Culture)  Once,   R       Question:  Specimen Source  Answer:  Urine, Clean Catch   05/17/23 1904   05/17/23 1904  TSH  Once,   R        05/17/23 1904   05/17/23 1904  Hemoglobin A1c  Once,   R        05/17/23 1904   05/17/23 1902  CBC  (heparin)  Once,   R       Comments: Baseline for heparin therapy IF NOT ALREADY DRAWN.  Notify MD if PLT < 100 K.    05/17/23 1904   05/17/23 1902  Creatinine, serum  (heparin)  Once,   R       Comments: Baseline for heparin therapy IF NOT ALREADY DRAWN.    05/17/23 1904   05/17/23 1901  HIV Antibody (routine testing w rflx)  (HIV Antibody (Routine testing w reflex) panel)  Once,   R        05/17/23 1904   05/17/23 1901  Hemoglobin A1c  (Glycemic Control (SSI)  Q 4 Hours / Glycemic Control (SSI)  AC +/- HS)  Once,   R       Comments: To assess prior glycemic control    05/17/23 1904   05/17/23 1542  Urine Culture  Once,   URGENT       Question Answer Comment  Indication Dysuria   Patient immune status Immunocompromised   Release to patient Immediate      05/17/23 1542            Vitals/Pain Today's Vitals   05/17/23 1747 05/17/23 1748 05/17/23 1751 05/17/23 1849  BP:      Pulse:      Resp:      Temp:   98.6 F (37 C)   TempSrc:   Oral   SpO2:      Weight:      Height:      PainSc: 3  3   3       Isolation Precautions No active isolations  Medications Medications  atorvastatin (LIPITOR) tablet 40 mg (has no administration in time range)  insulin aspart (novoLOG) injection 0-6 Units (has no administration in time range)  heparin injection 5,000 Units (has no administration in time range)  0.9 %  sodium chloride infusion (has no administration in time range)  acetaminophen (TYLENOL) tablet 650 mg (has no administration in time range)    Or  acetaminophen (TYLENOL) suppository 650 mg (has no administration in time range)  ondansetron (ZOFRAN) tablet 4 mg (has no administration in time range)    Or  ondansetron (ZOFRAN) injection 4 mg (has no administration in time range)  albuterol (PROVENTIL) (2.5 MG/3ML) 0.083% nebulizer solution 2.5 mg (has no administration in time range)  cefTRIAXone (ROCEPHIN) 2 g in sodium chloride 0.9 % 100 mL IVPB (has no administration in time range)  sodium chloride 0.9 % bolus 1,000 mL (0 mLs Intravenous Stopped 05/17/23 1549)  iohexol (OMNIPAQUE) 350 MG/ML injection 60 mL (60 mLs Intravenous Contrast Given 05/17/23 1307)  cefTRIAXone (ROCEPHIN) 1 g in sodium chloride 0.9 % 100 mL IVPB (0 g Intravenous Stopped 05/17/23 1626)  fentaNYL (SUBLIMAZE) injection 50 mcg (50 mcg Intravenous Given 05/17/23 1804)    Mobility walks     Focused Assessments     R Recommendations: See Admitting Provider Note  Report given to:   Additional Notes:

## 2023-05-17 NOTE — H&P (Signed)
History and Physical    Yoshimi Cuttler BMW:[id number] DOB: Nov 16, 1955 DOA: 05/17/2023  PCP: Pcp, No  Patient coming from: home  I have personally briefly reviewed patient's old medical records in [facility]  Chief Complaint: fever chills dysuria x 3days  HPI: Nicholas Johnston is a 67 y.o. male with medical history significant of  DMII, HTN who presents to ED with 3 days of fever chills/ HA and dysuria.  He noted no flank pain but does have mild suprapubic pain. He notes no sob/ cough/ n/v/d.  He does note elevated sugars at home in300's   ED Course:  Vitals, afebrile, bp 107/71, hr 114, rr 18, sat 99^  EKG: nsr Wbc 16, hgb 12.8 Lipase 23 Na 133, K 97, glu 388, cr 1.82 Ua:  mod hgb + rbc + wbc , + rare bacteria CTAb IMPRESSION: Bilateral pyelonephritis. No evidence of renal abscess or hydronephrosis.   Tx ns 1L/ctx/fentanyl Review of Systems: As per HPI otherwise 10 point review of systems negative.   Past Medical History:  Diagnosis Date   Diabetes mellitus without complication (HCC)   HLD Hypothyroidism  History reviewed. No pertinent surgical history.   reports that he has never smoked. He has never used smokeless tobacco. He reports current alcohol use. He reports that he does not use drugs.  No Known Allergies  Family History  Family history unknown: Yes    Prior to Admission medications   Medication Sig Start Date End Date Taking? Authorizing Provider  acetaminophen (TYLENOL) 500 MG tablet Take 1,000 mg by mouth 2 (two) times daily as needed for moderate pain, fever or headache.   Yes [provider]  atorvastatin (LIPITOR) 40 MG tablet Take 40 mg by mouth daily. Patient not taking: Reported on 05/17/2023    [provider]  levothyroxine (SYNTHROID) 25 MCG tablet Take 25 mcg by mouth every morning. Patient not taking: Reported on 05/17/2023    [provider]  metFORMIN (GLUCOPHAGE) 1000 MG tablet Take 1,000 mg by mouth daily. Patient not  taking: Reported on 05/17/2023    [provider]    Physical Exam: Vitals:   05/17/23 1645 05/17/23 1700 05/17/23 1715 05/17/23 1751  BP: 115/70 118/64 111/66   Pulse: (!) 101 (!) 101 (!) 103   Resp: (!) 29 (!) 26 (!) 29   Temp:    98.6 F (37 C)  TempSrc:    Oral  SpO2: 96% 97% 97%   Weight:      Height:        Constitutional: NAD, calm, comfortable Vitals:   05/17/23 1645 05/17/23 1700 05/17/23 1715 05/17/23 1751  BP: 115/70 118/64 111/66   Pulse: (!) 101 (!) 101 (!) 103   Resp: (!) 29 (!) 26 (!) 29   Temp:    98.6 F (37 C)  TempSrc:    Oral  SpO2: 96% 97% 97%   Weight:      Height:       Eyes: PERRL, lids and conjunctivae normal ENMT: Mucous membranes are moist. Posterior pharynx clear of any exudate or lesions.poor dentition Neck: normal, supple, no masses, no thyromegaly Respiratory: clear to auscultation bilaterally, no wheezing, no crackles. Normal respiratory effort. No accessory muscle use.  Cardiovascular: Regular rate and rhythm, no murmurs / rubs / gallops. No extremity edema. 2+ pedal pulses. Abdomen: no tenderness, right sided costovertebral angle tenderness,no masses palpated. No hepatosplenomegaly. Bowel sounds positive.  Musculoskeletal: no clubbing / cyanosis. No joint deformity upper and lower extremities. Good  ROM, no contractures. Normal muscle tone.  Skin: no rashes, lesions, ulcers. No induration Neurologic: CN 2-12 grossly intact. Sensation intact, Strength 5/5 in all 4.  Psychiatric: Normal judgment and insight. Alert and oriented x 3. Normal mood.    Labs on Admission: I have personally reviewed following labs and imaging studies  CBC: Recent Labs  Lab 05/17/23 1015  WBC 16.5*  HGB 12.8*  HCT 40.0  MCV 84.2  PLT 197   Basic Metabolic Panel: Recent Labs  Lab 05/17/23 1015  NA 133*  K 4.4  CL 97*  CO2 26  GLUCOSE 388*  BUN 20  CREATININE 1.82*  CALCIUM 8.5*   GFR: Estimated Creatinine Clearance: 35.7 mL/min (A) (by  C-G formula based on SCr of 1.82 mg/dL (H)). Liver Function Tests: Recent Labs  Lab 05/17/23 1015  AST 15  ALT 28  ALKPHOS 84  BILITOT 0.7  PROT 7.0  ALBUMIN 3.0*   Recent Labs  Lab 05/17/23 1015  LIPASE 23   No results for input(s): "AMMONIA" in the last 168 hours. Coagulation Profile: No results for input(s): "INR", "PROTIME" in the last 168 hours. Cardiac Enzymes: No results for input(s): "CKTOTAL", "CKMB", "CKMBINDEX", "TROPONINI" in the last 168 hours. BNP (last 3 results) No results for input(s): "PROBNP" in the last 8760 hours. HbA1C: No results for input(s): "HGBA1C" in the last 72 hours. CBG: No results for input(s): "GLUCAP" in the last 168 hours. Lipid Profile: No results for input(s): "CHOL", "HDL", "LDLCALC", "TRIG", "CHOLHDL", "LDLDIRECT" in the last 72 hours. Thyroid Function Tests: No results for input(s): "TSH", "T4TOTAL", "FREET4", "T3FREE", "THYROIDAB" in the last 72 hours. Anemia Panel: No results for input(s): "VITAMINB12", "FOLATE", "FERRITIN", "TIBC", "IRON", "RETICCTPCT" in the last 72 hours. Urine analysis:    Component Value Date/Time   COLORURINE YELLOW 05/17/2023 1034   APPEARANCEUR HAZY (A) 05/17/2023 1034   LABSPEC 1.025 05/17/2023 1034   PHURINE 5.0 05/17/2023 1034   GLUCOSEU >=500 (A) 05/17/2023 1034   HGBUR MODERATE (A) 05/17/2023 1034   BILIRUBINUR NEGATIVE 05/17/2023 1034   KETONESUR 5 (A) 05/17/2023 1034   PROTEINUR 100 (A) 05/17/2023 1034   NITRITE NEGATIVE 05/17/2023 1034   LEUKOCYTESUR NEGATIVE 05/17/2023 1034    Radiological Exams on Admission: CT ABDOMEN PELVIS W CONTRAST  Result Date: 05/17/2023 CLINICAL DATA:  Bilateral lower quadrant pain and right flank pain for 3 days. Dizziness. EXAM: CT ABDOMEN AND PELVIS WITH CONTRAST TECHNIQUE: Multidetector CT imaging of the abdomen and pelvis was performed using the standard protocol following bolus administration of intravenous contrast. RADIATION DOSE REDUCTION: This exam was  performed according to the departmental dose-optimization program which includes automated exposure control, adjustment of the mA and/or kV according to patient size and/or use of iterative reconstruction technique. CONTRAST:  60mL OMNIPAQUE IOHEXOL 350 MG/ML SOLN COMPARISON:  None Available. FINDINGS: Lower Chest: No acute findings. Hepatobiliary: No suspicious hepatic masses identified. Gallbladder is unremarkable. No evidence of biliary ductal dilatation. Pancreas:  No mass or inflammatory changes. Spleen: Within normal limits in size and appearance. Adrenals/Urinary Tract: Multiple ill-defined areas of decreased parenchymal contrast enhancement are seen in both kidneys, consistent with pyelonephritis. No evidence of renal mass or abscess. No evidence of ureteral calculi or hydronephrosis. Unremarkable unopacified urinary bladder. Stomach/Bowel: No evidence of obstruction, inflammatory process or abnormal fluid collections. Normal appendix visualized. Vascular/Lymphatic: No pathologically enlarged lymph nodes. No acute vascular findings. Reproductive:  No mass or other significant abnormality. Other:  None. Musculoskeletal:  No suspicious bone lesions identified. IMPRESSION: Bilateral pyelonephritis. No  evidence of renal abscess or hydronephrosis. Electronically Signed   By: Danae Orleans M.D.   On: 05/17/2023 15:19    EKG: Independently reviewed.   Assessment/Plan  Acute B/L pyelonephritis  Early sepsis -admit to med /tele  - continue on ctx 2 gram  - ivfs supportively  - f/u on urine /blood culture   Uncontrolled  DMII with hyperglycemia - place on iss /fs  -lantus 5 units  -diabetic RN to see  -f/u with A1c    Hypothyroidism -check tsh   HLD -check lipid panel  DVT prophylaxis:  heparin Code Status: full/ as discussed per patient wishes in event of cardiac arrest  Family Communication:  Family at bedside updated   Disposition Plan:  Consults called: diabetic RN Admission status:  med tele   Lurline Del MD [facility]  If 7PM-7AM, please contact night-coverage [email or web address] Password [email or web address]  05/17/2023, 7:05 PM

## 2023-05-17 NOTE — ED Triage Notes (Signed)
Pt c/o dizziness, HA and pain in neck, right flank painx3d. Pt denies N/V, SOB

## 2023-05-17 NOTE — ED Provider Notes (Cosign Needed Addendum)
[facility] Provider Note   CSN: [id number] Arrival date & time: 05/17/23  2952     History  Chief Complaint  Patient presents with   Fever   Dysuria   Dizziness    Nicholas Johnston is a 67 y.o. male with past medical history of HLD, diabetes (not insulin-dependent), hypothyroidism presents to the emergency department for evaluation of intermittent subjective fever, bilateral lower abdominal pain, right flank pain since Friday.  He reports that abdominal pain occurs with his subjective fevers.  He reports that he takes Tylenol and ibuprofen for fevers. He states that his urine is "hot" but denies frequency, dysuria, hematuria. He denies cough, shortness of breath, chest pain.  The history is provided by the patient and a relative. The history is limited by a language barrier. A language interpreter was used.  Fever Associated symptoms: dysuria and nausea   Associated symptoms: no chest pain, no diarrhea and no vomiting   Dysuria Presenting symptoms: dysuria   Associated symptoms: abdominal pain, fever and nausea   Associated symptoms: no diarrhea and no vomiting   Dizziness Associated symptoms: nausea   Associated symptoms: no chest pain, no diarrhea, no shortness of breath and no vomiting       Home Medications Prior to Admission medications   Medication Sig Start Date End Date Taking? Authorizing Provider  atorvastatin (LIPITOR) 40 MG tablet Take 40 mg by mouth daily.    [provider]  cephALEXin (KEFLEX) 500 MG capsule Take 1 capsule (500 mg total) by mouth 4 (four) times daily. 04/14/22   Renne Crigler, PA-C  HYDROcodone-acetaminophen (NORCO/VICODIN) 5-325 MG tablet Take 1 tablet by mouth every 6 (six) hours as needed for severe pain. 04/14/22   Renne Crigler, PA-C  levothyroxine (SYNTHROID) 25 MCG tablet Take 25 mcg by mouth every morning.    [provider]  metFORMIN (GLUCOPHAGE) 1000 MG tablet Take 1,000 mg by  mouth daily.    [provider]      Allergies    Patient has no known allergies.    Review of Systems   Review of Systems  Constitutional:  Positive for fever.  Respiratory:  Negative for shortness of breath.   Cardiovascular:  Negative for chest pain.  Gastrointestinal:  Positive for abdominal pain and nausea. Negative for diarrhea and vomiting.  Genitourinary:  Positive for dysuria.  Neurological:  Positive for dizziness.    Physical Exam Updated Vital Signs BP 117/71   Pulse 95   Temp 99 F (37.2 C) (Oral)   Resp 18   Ht 5\' 5"  (1.651 m)   Wt 71.7 kg   SpO2 97%   BMI 26.30 kg/m  Physical Exam Vitals and nursing note reviewed.  Constitutional:      General: He is not in acute distress.    Appearance: Normal appearance.  HENT:     Head: Normocephalic and atraumatic.  Eyes:     Conjunctiva/sclera: Conjunctivae normal.  Cardiovascular:     Rate and Rhythm: Normal rate.  Pulmonary:     Effort: Pulmonary effort is normal. No respiratory distress.  Abdominal:     General: There is no distension.     Palpations: Abdomen is soft.     Tenderness: There is abdominal tenderness in the right lower quadrant and left lower quadrant. There is right CVA tenderness and left CVA tenderness.     Hernia: No hernia is present.     Comments: No cullen sign No grey turner  sign  Musculoskeletal:     Cervical back: Normal range of motion and neck supple. No rigidity or tenderness.  Skin:    Coloration: Skin is not jaundiced or pale.  Neurological:     Mental Status: He is alert. Mental status is at baseline.    ED Results / Procedures / Treatments   Labs (all labs ordered are listed, but only abnormal results are displayed) Labs Reviewed  COMPREHENSIVE METABOLIC PANEL - Abnormal; Notable for the following components:      Result Value   Sodium 133 (*)    Chloride 97 (*)    Glucose, Bld 388 (*)    Creatinine, Ser 1.82 (*)    Calcium 8.5 (*)    Albumin 3.0 (*)     GFR, Estimated 41 (*)    All other components within normal limits  CBC - Abnormal; Notable for the following components:   WBC 16.5 (*)    Hemoglobin 12.8 (*)    All other components within normal limits  URINALYSIS, ROUTINE W REFLEX MICROSCOPIC - Abnormal; Notable for the following components:   APPearance HAZY (*)    Glucose, UA >=500 (*)    Hgb urine dipstick MODERATE (*)    Ketones, ur 5 (*)    Protein, ur 100 (*)    Bacteria, UA RARE (*)    Non Squamous Epithelial 0-5 (*)    All other components within normal limits  URINE CULTURE  LIPASE, BLOOD    EKG EKG Interpretation Date/Time:  Monday May 17 2023 10:00:57 EDT Ventricular Rate:  100 PR Interval:  132 QRS Duration:  80 QT Interval:  312 QTC Calculation: 402 R Axis:   56  Text Interpretation: Normal sinus rhythm Nonspecific ST abnormality Abnormal ECG No previous ECGs available no prior ECG for comparison No STEMI Confirmed by Theda Belfast ([id number]) on 05/17/2023 11:06:43 AM  Radiology CT ABDOMEN PELVIS W CONTRAST  Result Date: 05/17/2023 CLINICAL DATA:  Bilateral lower quadrant pain and right flank pain for 3 days. Dizziness. EXAM: CT ABDOMEN AND PELVIS WITH CONTRAST TECHNIQUE: Multidetector CT imaging of the abdomen and pelvis was performed using the standard protocol following bolus administration of intravenous contrast. RADIATION DOSE REDUCTION: This exam was performed according to the departmental dose-optimization program which includes automated exposure control, adjustment of the mA and/or kV according to patient size and/or use of iterative reconstruction technique. CONTRAST:  60mL OMNIPAQUE IOHEXOL 350 MG/ML SOLN COMPARISON:  None Available. FINDINGS: Lower Chest: No acute findings. Hepatobiliary: No suspicious hepatic masses identified. Gallbladder is unremarkable. No evidence of biliary ductal dilatation. Pancreas:  No mass or inflammatory changes. Spleen: Within normal limits in size and appearance.  Adrenals/Urinary Tract: Multiple ill-defined areas of decreased parenchymal contrast enhancement are seen in both kidneys, consistent with pyelonephritis. No evidence of renal mass or abscess. No evidence of ureteral calculi or hydronephrosis. Unremarkable unopacified urinary bladder. Stomach/Bowel: No evidence of obstruction, inflammatory process or abnormal fluid collections. Normal appendix visualized. Vascular/Lymphatic: No pathologically enlarged lymph nodes. No acute vascular findings. Reproductive:  No mass or other significant abnormality. Other:  None. Musculoskeletal:  No suspicious bone lesions identified. IMPRESSION: Bilateral pyelonephritis. No evidence of renal abscess or hydronephrosis. Electronically Signed   By: Danae Orleans M.D.   On: 05/17/2023 15:19    Procedures .Critical Care  Performed by: Judithann Sheen, PA Authorized by: Judithann Sheen, PA   Critical care provider statement:    Critical care time (minutes):  30   Critical care was necessary to  treat or prevent imminent or life-threatening deterioration of the following conditions:  Sepsis   Critical care was time spent personally by me on the following activities:  Development of treatment plan with patient or surrogate, discussions with consultants, evaluation of patient's response to treatment, examination of patient, ordering and review of laboratory studies, ordering and review of radiographic studies, ordering and performing treatments and interventions, pulse oximetry, re-evaluation of patient's condition and review of old charts     Medications Ordered in ED Medications  cefTRIAXone (ROCEPHIN) 1 g in sodium chloride 0.9 % 100 mL IVPB (has no administration in time range)  sodium chloride 0.9 % bolus 1,000 mL (1,000 mLs Intravenous New Bag/Given 05/17/23 1208)  iohexol (OMNIPAQUE) 350 MG/ML injection 60 mL (60 mLs Intravenous Contrast Given 05/17/23 1307)    ED Course/ Medical Decision Making/ A&P                                   Medical Decision Making Amount and/or Complexity of Data Reviewed Labs: ordered. Radiology: ordered. ECG/medicine tests: ordered.  Risk Prescription drug management. Decision regarding hospitalization.   Upon evaluation, patient is resting comfortably in bed.  No physical signs of distress.  He is not ill-appearing.  Vital signs WNL.  PE significant for bilateral lower abdominal tenderness to palpation. see HPI.  Patient presents to the ED for concern of lateral lower abdominal pain and subjective fever since Friday, this involves an extensive number of treatment options, and is a complaint that carries with it a high risk of complications and morbidity.  The differential diagnosis includes pylonephritis, appendicitis, diverticulosis, colitis, viral gastroenteritis   Co morbidities that complicate the patient evaluation  Diabetes (not insulin-dependent)   Additional history obtained:  Additional history obtained from  Family, Nursing, and Outside Medical Records   External records from outside source obtained upon chart review   Lab Tests:  I Ordered, and personally interpreted labs.  The pertinent results include: Leukocytosis (16.5) Elevated creatinine (1.82) / AKI GFR 41 UA with glucose, Hgb, protein but negative for infection   Imaging Studies ordered:  I ordered imaging studies including CT abdomen pelvis w contrast I independently visualized and interpreted imaging which showed bilateral pyelonephritis (see report) I agree with the radiologist interpretation   Cardiac Monitoring:  The patient was maintained on a cardiac monitor.  I personally viewed and interpreted the cardiac monitored which showed an underlying rhythm of: NSR with no ST or ischemic changes.   Medicines ordered and prescription drug management:  I ordered medication including NS bolus  for possible dehydration, infection, elevated creatinine Reevaluation of the patient  after these medicines showed that the patient stayed the same I have reviewed the patients home medicines and have made adjustments as needed    Problem List / ED Course:  Bilateral lower abdominal tenderness Subjective fever Right flank pain   Reevaluation:  After the interventions noted above, I reevaluated the patient and found that they have :stayed the same    Dispostion:  On assessment, patient is tender to bilateral lower abdomen. Bilateral CVA tenderness appreciated. He has no complaints of chest pain, shortness of breath, vomiting, diarrhea.In ED, he is afebrile and hemodynamically stable.  Labwork for leukocytosis (16.5), hyperglycemia (388), elevated creatinine (1.82). 1L administered for possible dehydration, UTI, AKI. UA negative for bacteruria. CT significant for bilateral pylonephritis. Will obtain urine culture and initiate 1g Rocephin in ED  After consideration  of the diagnostic results and the patients response to treatment, I feel that the patent would benefit from admission as patient has poor follow up and is more comfortable with admission for IV antibiotics.   Admission accepted by Dr. Maisie Fus hospitalist for [facility]   Final Clinical Impression(s) / ED Diagnoses Final diagnoses:  Pyelonephritis  AKI (acute kidney injury) [place] [facility])    Rx / DC Orders ED Discharge Orders     None         Judithann Sheen, PA 05/17/23 1545    Tegeler, Canary Brim, MD 05/17/23 1554    Judithann Sheen, PA 05/17/23 1601    Tegeler, Canary Brim, MD 05/17/23 1605    Judithann Sheen, PA 05/17/23 1644    Tegeler, Canary Brim, MD 05/18/23 [phone]

## 2023-05-17 NOTE — Plan of Care (Signed)

## 2023-05-18 DIAGNOSIS — N10 Acute tubulo-interstitial nephritis: Secondary | ICD-10-CM | POA: Diagnosis not present

## 2023-05-18 LAB — CBC
HCT: 33.6 % — ABNORMAL LOW (ref 39.0–52.0)
Hemoglobin: 11 g/dL — ABNORMAL LOW (ref 13.0–17.0)
MCH: 27 pg (ref 26.0–34.0)
MCHC: 32.7 g/dL (ref 30.0–36.0)
MCV: 82.4 fL (ref 80.0–100.0)
Platelets: 190 10*3/uL (ref 150–400)
RBC: 4.08 MIL/uL — ABNORMAL LOW (ref 4.22–5.81)
RDW: 13 % (ref 11.5–15.5)
WBC: 12.3 10*3/uL — ABNORMAL HIGH (ref 4.0–10.5)
nRBC: 0 % (ref 0.0–0.2)

## 2023-05-18 LAB — GLUCOSE, CAPILLARY
Glucose-Capillary: 228 mg/dL — ABNORMAL HIGH (ref 70–99)
Glucose-Capillary: 306 mg/dL — ABNORMAL HIGH (ref 70–99)
Glucose-Capillary: 252 mg/dL — ABNORMAL HIGH (ref 70–99)

## 2023-05-18 LAB — URINE CULTURE

## 2023-05-18 MED ORDER — LEVOTHYROXINE SODIUM 25 MCG PO TABS
25.0000 ug | ORAL_TABLET | Freq: Every morning | ORAL | Status: DC
  Administered 2023-05-19 – 2023-05-20 (×2): 25 ug via ORAL
  Filled 2023-05-18 (×2): qty 1

## 2023-05-18 MED ORDER — SODIUM CHLORIDE 0.9 % IV SOLN: INTRAVENOUS | Status: DC

## 2023-05-18 MED ORDER — INSULIN STARTER KIT- PEN NEEDLES (ENGLISH)
1.0000 | Freq: Once | Status: DC
  Filled 2023-05-18: qty 1

## 2023-05-18 MED ORDER — LIVING WELL WITH DIABETES BOOK
Freq: Once | Status: DC
  Filled 2023-05-18: qty 1

## 2023-05-18 NOTE — Plan of Care (Signed)
  Problem: Education: Goal: Ability to describe self-care measures that may prevent or decrease complications (Diabetes Survival Skills Education) will improve Outcome: Progressing   Problem: Coping: Goal: Ability to adjust to condition or change in health will improve Outcome: Progressing   Problem: Health Behavior/Discharge Planning: Goal: Ability to identify and utilize available resources and services will improve Outcome: Progressing   Problem: Health Behavior/Discharge Planning: Goal: Ability to manage health-related needs will improve Outcome: Progressing   Problem: Metabolic: Goal: Ability to maintain appropriate glucose levels will improve Outcome: Progressing   Problem: Nutritional: Goal: Maintenance of adequate nutrition will improve Outcome: Progressing   Problem: Skin Integrity: Goal: Risk for impaired skin integrity will decrease Outcome: Progressing   Problem: Tissue Perfusion: Goal: Adequacy of tissue perfusion will improve Outcome: Progressing   Problem: Health Behavior/Discharge Planning: Goal: Ability to manage health-related needs will improve Outcome: Progressing   Problem: Education: Goal: Knowledge of General Education information will improve Description: Including pain rating scale, medication(s)/side effects and non-pharmacologic comfort measures Outcome: Progressing   Problem: Clinical Measurements: Goal: Will remain free from infection Outcome: Progressing   Problem: Nutrition: Goal: Adequate nutrition will be maintained Outcome: Progressing   Problem: Activity: Goal: Risk for activity intolerance will decrease Outcome: Progressing

## 2023-05-18 NOTE — Inpatient Diabetes Management (Signed)
Inpatient Diabetes Program Recommendations  AACE/ADA: New Consensus Statement on Inpatient Glycemic Control (2015)  Target Ranges:  Prepandial:   less than 140 mg/dL      Peak postprandial:   less than 180 mg/dL (1-2 hours)      Critically ill patients:  140 - 180 mg/dL   Lab Results  Component Value Date   GLUCAP 252 (H) 05/18/2023   HGBA1C 11.1 (H) 05/17/2023    Review of Glycemic Control  Spoke with pt's daughter and educated on insulin pen administration. Was able to return demonstration. Answered all questions. Discussed hypoglycemia s/s and treatment.  Daughter states pt's PCP is in [place] and would like PCP here in [place]. Will order [facility] consult for obtaining PCP. Pt has meter at home. Will check with Pharmacy regarding which CGM is covered by his insurance.   Continue to follow.  Thank you. Ailene Ards, RD, LDN, CDCES Inpatient Diabetes Coordinator [phone]

## 2023-05-18 NOTE — Inpatient Diabetes Management (Signed)
Inpatient Diabetes Program Recommendations  AACE/ADA: New Consensus Statement on Inpatient Glycemic Control (2015)  Target Ranges:  Prepandial:   less than 140 mg/dL      Peak postprandial:   less than 180 mg/dL (1-2 hours)      Critically ill patients:  140 - 180 mg/dL   Lab Results  Component Value Date   GLUCAP 306 (H) 05/18/2023   HGBA1C 11.1 (H) 05/17/2023    Review of Glycemic Control  Diabetes history: DM2 Outpatient Diabetes medications: metformin 1000 mg daily (not taking) Current orders for Inpatient glycemic control: Semglee 5 daily, Novolog 0-9 TID with meals  HgbA1C - 11.1% CBGs 228, 306 mg/dL  Inpatient Diabetes Program Recommendations:    Consider increasing Semglee to 8 units every day  Consider adding Novolog 2 units TID with meals if eating > 50%  Add correction Novolog 0-5 units at bedtime  Spoke with patient about new diabetes diagnosis.  Discussed A1C results (11.1%) and explained what an A1C is and informed patient that his current A1C indicates an average glucose of 272 mg/dl over the past 2-3 months. Discussed basic pathophysiology of DM Type 2, basic home care, importance of checking CBGs and maintaining good CBG control to prevent long-term and short-term complications. Reviewed glucose and A1C goals. Reviewed signs and symptoms of hyperglycemia and hypoglycemia along with treatment for both. Discussed impact of nutrition, exercise, stress, sickness, and medications on diabetes control. Ordered Living Well with diabetes booklet and encouraged patient to read through entire book.  Pt has PCP and will need to f/u after hospital discharge. Educated patient and spouse on insulin pen use at home. Reviewed all steps if insulin pen including attachment of needle, 2-unit air shot, dialing up dose, giving injection, removing needle, disposal of sharps, storage of unused insulin, disposal of insulin etc. Patient did not want to return demonstration.  Daughter was  not in room. Will need to call and make sure pt understood what he will need to do to manage his diabetes.  Needs more education before discharge.   Continue to follow.  Thank you. Ailene Ards, RD, LDN, CDCES Inpatient Diabetes Coordinator [phone]

## 2023-05-18 NOTE — Progress Notes (Addendum)
PROGRESS NOTE    Nicholas Johnston  NWG:[id number] DOB: 06-Mar-1956 DOA: 05/17/2023  PCP: Pcp, No    Brief Narrative:  This 67 years old male with PMH significant for DM2, hypertension presented in the ED with complaints of fever,  chills , headache and dysuria for the last 3 days.  Patient denies any flank pain but does report having mild suprapubic pain.  Patient also reports that her blood sugars has been running high in 300s at home. ED workup.  WBC 16K, lipase 23, UA: WBC+, rare bacteria+, CT abdomen and pelvis shows bilateral pyelonephritis, no evidence of renal abscess or hydronephrosis. Patient is admitted for further evaluation and started on IV antibiotics.  Assessment & Plan:   Principal Problem:   Acute pyelonephritis  Acute bilateral pyelonephritis: Early sepsis: Patient presented with hypotension, tachycardia, leukocytosis, UA+,   CT >  Acute Bilateral pyelonephritis,  no abscess, no hydronephrosis Started on IV antibiotics. Continue ceftriaxone 2 g  IV daily Continue IV fluid resuscitation. Follow blood and urine cultures. Adequate pain control with pain medications,  Continue IV Zofran as needed for nausea and vomiting.  AKI: Unknown baseline.  Presented with creatinine 1.84. Continue IV hydration, avoid nephrotoxic medications,  Serum creatinine improving 1.51  Hypothyroidism: Continue levothyroxine.  Hyperlipidemia: Continue Lipitor.  Type 2 diabetes with hyperglycemia: Hold p.o. diabetic medications, Continue Lantus 5 units daily Continue regular insulin sliding scale. Carb modified diet. Obtain Hb A1c   DVT prophylaxis: Lovenox Code Status: Full code Family Communication: Daughter at bed side. Disposition Plan:    Status is: Inpatient Remains inpatient appropriate because: Admitted for acute bilateral pyelonephritis requiring IV antibiotics,  awaiting blood and urine cultures.    Consultants:  None  Procedures: CT  A/P  Antimicrobials:Ceftriaxone   Subjective: Patient was seen and examined at bedside.  Overnight events noted.   Patient still reports having mild suprapubic pain.  Denies any back pain.  Objective: Vitals:   05/17/23 2321 05/18/23 0315 05/18/23 0410 05/18/23 0729  BP: 105/64 106/70  109/73  Pulse: (!) 106 94  100  Resp:    18  Temp: 98.6 F (37 C) 99.7 F (37.6 C)  98.9 F (37.2 C)  TempSrc: Oral Oral    SpO2: 95% 97%  95%  Weight:   70.7 kg   Height:        Intake/Output Summary (Last 24 hours) at 05/18/2023 1112 Last data filed at 05/18/2023 0508 Gross per 24 hour  Intake 772.68 ml  Output 600 ml  Net 172.68 ml   Filed Weights   05/17/23 1007 05/17/23 2033 05/18/23 0410  Weight: 71.7 kg 68.9 kg 70.7 kg    Examination:  General exam: Appears calm and comfortable, deconditioned, not in any acute distress. Respiratory system: Clear to auscultation. Respiratory effort normal.  RR 15 Cardiovascular system: S1 & S2 heard, RRR. No Murmer,  No pedal edema. Gastrointestinal system: Abdomen is soft, nondistended, mildly tender in suprapubic region.  Bowel sounds heard. Central nervous system: Alert and oriented x 3. No focal neurological deficits. Extremities: No edema, no cyanosis, no clubbing Skin: No rashes, lesions or ulcers Psychiatry: Judgement and insight appear normal. Mood & affect appropriate.     Data Reviewed: I have personally reviewed following labs and imaging studies  CBC: Recent Labs  Lab 05/17/23 1015 05/17/23 2049 05/18/23 0629  WBC 16.5* 15.4* 12.3*  HGB 12.8* 12.5* 11.0*  HCT 40.0 38.7* 33.6*  MCV 84.2 83.4 82.4  PLT 197 218 190   Basic Metabolic  Panel: Recent Labs  Lab 05/17/23 1015 05/17/23 2048  NA 133* 134*  K 4.4 4.1  CL 97* 97*  CO2 26 23  GLUCOSE 388* 411*  BUN 20 19  CREATININE 1.82* 1.51*  CALCIUM 8.5* 8.5*   GFR: Estimated Creatinine Clearance: 43 mL/min (A) (by C-G formula based on SCr of 1.51 mg/dL (H)). Liver  Function Tests: Recent Labs  Lab 05/17/23 1015  AST 15  ALT 28  ALKPHOS 84  BILITOT 0.7  PROT 7.0  ALBUMIN 3.0*   Recent Labs  Lab 05/17/23 1015  LIPASE 23   No results for input(s): "AMMONIA" in the last 168 hours. Coagulation Profile: No results for input(s): "INR", "PROTIME" in the last 168 hours. Cardiac Enzymes: No results for input(s): "CKTOTAL", "CKMB", "CKMBINDEX", "TROPONINI" in the last 168 hours. BNP (last 3 results) No results for input(s): "PROBNP" in the last 8760 hours. HbA1C: Recent Labs    05/17/23 2049  HGBA1C 11.1*   CBG: Recent Labs  Lab 05/17/23 2035 05/17/23 2339 05/18/23 0734  GLUCAP 406* 256* 228*   Lipid Profile: Recent Labs    05/17/23 2051  CHOL 97  HDL 23*  LDLCALC 49  TRIG 132  CHOLHDL 4.2   Thyroid Function Tests: Recent Labs    05/17/23 2049  TSH 1.929   Anemia Panel: No results for input(s): "VITAMINB12", "FOLATE", "FERRITIN", "TIBC", "IRON", "RETICCTPCT" in the last 72 hours. Sepsis Labs: No results for input(s): "PROCALCITON", "LATICACIDVEN" in the last 168 hours.  Recent Results (from the past 240 hour(s))  Urine Culture     Status: Abnormal   Collection Time: 05/17/23  4:01 PM   Specimen: Urine, Clean Catch  Result Value Ref Range Status   Specimen Description URINE, CLEAN CATCH  Final   Special Requests   Final    Immunocompromised Performed at [facility], [address] [place], [place] [address]    Culture MULTIPLE SPECIES PRESENT, SUGGEST RECOLLECTION (A)  Final   Report Status 05/18/2023 FINAL  Final  Culture, blood (Routine X 2) w Reflex to ID Panel     Status: None (Preliminary result)   Collection Time: 05/17/23  8:49 PM   Specimen: BLOOD  Result Value Ref Range Status   Specimen Description BLOOD BLOOD RIGHT ARM  Final   Special Requests   Final    BOTTLES DRAWN AEROBIC AND ANAEROBIC Blood Culture adequate volume   Culture   Final    NO GROWTH < 12 HOURS Performed at [facility], [address] [place], [place] [address]    Report Status PENDING  Incomplete  Culture, blood (Routine X 2) w Reflex to ID Panel     Status: None (Preliminary result)   Collection Time: 05/17/23  8:51 PM   Specimen: BLOOD  Result Value Ref Range Status   Specimen Description BLOOD BLOOD RIGHT HAND  Final   Special Requests   Final    BOTTLES DRAWN AEROBIC AND ANAEROBIC Blood Culture adequate volume   Culture   Final    NO GROWTH < 12 HOURS Performed at [facility], [address], [place], [place] [address]    Report Status PENDING  Incomplete    Radiology Studies: CT ABDOMEN PELVIS W CONTRAST  Result Date: 05/17/2023 CLINICAL DATA:  Bilateral lower quadrant pain and right flank pain for 3 days. Dizziness. EXAM: CT ABDOMEN AND PELVIS WITH CONTRAST TECHNIQUE: Multidetector CT imaging of the abdomen and pelvis was performed using the standard protocol following  bolus administration of intravenous contrast. RADIATION DOSE REDUCTION: This exam was performed according to the departmental dose-optimization program which includes automated exposure control, adjustment of the mA and/or kV according to patient size and/or use of iterative reconstruction technique. CONTRAST:  60mL OMNIPAQUE IOHEXOL 350 MG/ML SOLN COMPARISON:  None Available. FINDINGS: Lower Chest: No acute findings. Hepatobiliary: No suspicious hepatic masses identified. Gallbladder is unremarkable. No evidence of biliary ductal dilatation. Pancreas:  No mass or inflammatory changes. Spleen: Within normal limits in size and appearance. Adrenals/Urinary Tract: Multiple ill-defined areas of decreased parenchymal contrast enhancement are seen in both kidneys, consistent with pyelonephritis. No evidence of renal mass or abscess. No evidence of ureteral calculi or hydronephrosis. Unremarkable unopacified urinary bladder. Stomach/Bowel: No evidence of obstruction, inflammatory process or abnormal fluid collections. Normal  appendix visualized. Vascular/Lymphatic: No pathologically enlarged lymph nodes. No acute vascular findings. Reproductive:  No mass or other significant abnormality. Other:  None. Musculoskeletal:  No suspicious bone lesions identified. IMPRESSION: Bilateral pyelonephritis. No evidence of renal abscess or hydronephrosis. Electronically Signed   By: Danae Orleans M.D.   On: 05/17/2023 15:19    Scheduled Meds:  atorvastatin  40 mg Oral Daily   heparin  5,000 Units Subcutaneous Q8H   insulin aspart  0-5 Units Subcutaneous QHS   insulin aspart  0-9 Units Subcutaneous TID WC   insulin glargine-yfgn  5 Units Subcutaneous Daily   insulin starter kit- pen needles  1 kit Other Once   levothyroxine  25 mcg Oral q morning   living well with diabetes book   Does not apply Once   Continuous Infusions:  sodium chloride Stopped (05/18/23 0444)   cefTRIAXone (ROCEPHIN)  IV 200 mL/hr at 05/18/23 0508     LOS: 1 day    Time spent: 50 mins    Willeen Niece, MD [facility]   If 7PM-7AM, please contact night-coverage

## 2023-05-19 ENCOUNTER — Other Ambulatory Visit (HOSPITAL_COMMUNITY): Payer: Self-pay

## 2023-05-19 DIAGNOSIS — N10 Acute tubulo-interstitial nephritis: Secondary | ICD-10-CM | POA: Diagnosis not present

## 2023-05-19 LAB — BASIC METABOLIC PANEL WITH GFR
Anion gap: 8 (ref 5–15)
BUN: 19 mg/dL (ref 8–23)
CO2: 23 mmol/L (ref 22–32)
Calcium: 8 mg/dL — ABNORMAL LOW (ref 8.9–10.3)
Chloride: 106 mmol/L (ref 98–111)
Creatinine, Ser: 1.56 mg/dL — ABNORMAL HIGH (ref 0.61–1.24)
GFR, Estimated: 49 mL/min — ABNORMAL LOW
Glucose, Bld: 143 mg/dL — ABNORMAL HIGH (ref 70–99)
Potassium: 3.3 mmol/L — ABNORMAL LOW (ref 3.5–5.1)
Sodium: 137 mmol/L (ref 135–145)

## 2023-05-19 LAB — CBC
HCT: 33.9 % — ABNORMAL LOW (ref 39.0–52.0)
Hemoglobin: 11.1 g/dL — ABNORMAL LOW (ref 13.0–17.0)
MCH: 27.7 pg (ref 26.0–34.0)
MCHC: 32.7 g/dL (ref 30.0–36.0)
MCV: 84.5 fL (ref 80.0–100.0)
Platelets: 174 10*3/uL (ref 150–400)
RBC: 4.01 MIL/uL — ABNORMAL LOW (ref 4.22–5.81)
RDW: 13.2 % (ref 11.5–15.5)
WBC: 8 10*3/uL (ref 4.0–10.5)
nRBC: 0 % (ref 0.0–0.2)

## 2023-05-19 LAB — GLUCOSE, CAPILLARY
Glucose-Capillary: 136 mg/dL — ABNORMAL HIGH (ref 70–99)
Glucose-Capillary: 177 mg/dL — ABNORMAL HIGH (ref 70–99)
Glucose-Capillary: 313 mg/dL — ABNORMAL HIGH (ref 70–99)
Glucose-Capillary: 247 mg/dL — ABNORMAL HIGH (ref 70–99)

## 2023-05-19 LAB — MAGNESIUM: Magnesium: 2.2 mg/dL (ref 1.7–2.4)

## 2023-05-19 LAB — PHOSPHORUS: Phosphorus: 3.5 mg/dL (ref 2.5–4.6)

## 2023-05-19 MED ORDER — INSULIN GLARGINE-YFGN 100 UNIT/ML ~~LOC~~ SOLN
10.0000 [IU] | Freq: Every day | SUBCUTANEOUS | Status: DC
  Administered 2023-05-19: 10 [IU] via SUBCUTANEOUS
  Filled 2023-05-19 (×2): qty 0.1

## 2023-05-19 MED ORDER — POTASSIUM CHLORIDE CRYS ER 20 MEQ PO TBCR
40.0000 meq | EXTENDED_RELEASE_TABLET | Freq: Once | ORAL | Status: AC
  Administered 2023-05-19: 40 meq via ORAL
  Filled 2023-05-19: qty 2

## 2023-05-19 NOTE — Progress Notes (Signed)
Transition of Care [facility]) - Inpatient Brief Assessment   Patient Details  Name: Nicholas Johnston MRN: [id number] Date of Birth: October 15, 1955  Transition of [facility]) CM/SW Contact:    Janae Bridgeman, RN Phone Number: 05/19/2023, 12:06 PM   Clinical Narrative: CM met with the patient at the bedside to discuss TOC needs.  The patient admitted to the hospital from home - with Septicemia and acute pyelonephritis.  The patient is independent and lives with his family at the home.  Patient works full time and normally drives and provides for his own transportation.  Patient states that he needs a new primary care provider in the [place] area.  Patient's daughter is at the bedside.  I called [facility] and patient was scheduled for May 26, 2023 at 2:20 pm - included in the AVS.  Translator provided at the bedside since patient speaks Lucienne Minks.  No other TOC needs at this time.   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

## 2023-05-19 NOTE — Evaluation (Signed)
Occupational Therapy Evaluation and Discharge Patient Details Name: Nicholas Johnston MRN: [id number] DOB: [birth year] Today's Date: 05/19/2023   History of Present Illness Pt is a 67 year old man admitted on 9/30 with acute pyelonephritis, AKI and septicemia. PMH: HLD, DM, hypothyroidism.   Clinical Impression   Pt is functioning independently. No further OT needs. Encouraged to walk in halls.       If plan is discharge home, recommend the following:      Functional Status Assessment  Patient has not had a recent decline in their functional status  Equipment Recommendations  None recommended by OT    Recommendations for Other Services       Precautions / Restrictions Precautions Precautions: None      Mobility Bed Mobility Overal bed mobility: Independent                  Transfers Overall transfer level: Independent Equipment used: None                      Balance Overall balance assessment: Independent                                         ADL either performed or assessed with clinical judgement   ADL Overall ADL's : Independent                                             Vision Ability to See in Adequate Light: 0 Adequate Patient Visual Report: No change from baseline       Perception         Praxis         Pertinent Vitals/Pain Pain Assessment Pain Assessment: No/denies pain     Extremity/Trunk Assessment Upper Extremity Assessment Upper Extremity Assessment: Overall WFL for tasks assessed   Lower Extremity Assessment Lower Extremity Assessment: Overall WFL for tasks assessed   Cervical / Trunk Assessment Cervical / Trunk Assessment: Normal   Communication Communication Communication: No apparent difficulties;Other (comment) (daughter interpreted)   Cognition Arousal: Alert Behavior During Therapy: WFL for tasks assessed/performed Overall Cognitive Status: Within Functional Limits for  tasks assessed                                       General Comments       Exercises     Shoulder Instructions      Home Living Family/patient expects to be discharged to:: Private residence Living Arrangements: Spouse/significant other Available Help at Discharge: Family;Available 24 hours/day Type of Home: House                       Home Equipment: None          Prior Functioning/Environment Prior Level of Function : Independent/Modified Independent;Working/employed;Driving               ADLs Comments: works in a Paramedic Problem List:        OT Treatment/Interventions:      OT Goals(Current goals can be found in the care plan section)    OT Frequency:      Co-evaluation  AM-PAC OT "6 Clicks" Daily Activity     Outcome Measure Help from another person eating meals?: None Help from another person taking care of personal grooming?: None Help from another person toileting, which includes using toliet, bedpan, or urinal?: None Help from another person bathing (including washing, rinsing, drying)?: None Help from another person to put on and taking off regular upper body clothing?: None Help from another person to put on and taking off regular lower body clothing?: None 6 Click Score: 24   End of Session    Activity Tolerance: Patient tolerated treatment well Patient left: in bed;with call bell/phone within reach;with family/visitor present  OT Visit Diagnosis: Muscle weakness (generalized) (M62.81)                Time: 8657-8469 OT Time Calculation (min): 31 min Charges:  OT General Charges $OT Visit: 1 Visit OT Evaluation $OT Eval Low Complexity: 1 Low OT Treatments $Self Care/Home Management : 8-22 mins Berna Spare, OTR/L Acute Rehabilitation Services Office: [phone]  Evern Bio 05/19/2023, 3:41 PM

## 2023-05-19 NOTE — Progress Notes (Signed)
PROGRESS NOTE    Nicholas Johnston  ZOX:[id number] DOB: 1955/09/07 DOA: 05/17/2023 PCP: Pcp, No   Brief Narrative:  67 years old male with PMH significant for DM2, hypertension presented with fever, chills, dysuria and suprapubic pain.  On presentation, WBC was 16,000, UA suggestive of UTI.  CT of abdomen and pelvis showed bilateral pyelonephritis without renal abscess or hydronephrosis.  He was started on IV fluids and antibiotics.  Assessment & Plan:   Sepsis: Present on admission Acute bilateral pyelonephritis -Presented with hypotension, tachycardia, leukocytosis, bilateral pyelonephritis -Still spiking temperatures.  Tmax of 101.9 over the last 24 hours. -Urine culture grew multiple species.  Blood cultures negative so far.  Continue Rocephin.  Off IV fluids.  Leukocytosis -Resolved  AKI -Unknown baseline.  Creatinine 1.82 on presentation.  Treated with IV fluids.  Creatinine 1.56 today.  Monitor.  Diabetes mellitus type 2 uncontrolled with hyperglycemia -Recent A1c 11.1.  Increase long-acting insulin to 10 units daily.  Continue CBGs with SSI.  Carb modified diet.  Diabetes coordinator following  Hypothyroidism -Continue levothyroxine  Hyperlipidemia -Continue statin  Hyponatremia -Resolved  Hypokalemia -Replace.  Repeat a.m. labs  Normocytic anemia -Questionable cause.  Hemoglobin stable.  Monitor intermittently.   DVT prophylaxis: Heparin subcutaneous Code Status: Full Family Communication: Daughter at bedside, translates for the patient Disposition Plan: Status is: Inpatient Remains inpatient appropriate because: Of severity of illness.    Consultants: None  Procedures: None  Antimicrobials: Rocephin from 05/17/2023 onwards   Subjective: Patient seen and examined at bedside.  Still having fevers and chills.  Feels weak.  No vomiting, chest pain or shortness of breath reported.  Daughter at bedside translates for the patient.  Objective: Vitals:    05/19/23 0251 05/19/23 0308 05/19/23 0500 05/19/23 0715  BP:  105/69  113/70  Pulse:  73  87  Resp:    18  Temp: 98.7 F (37.1 C)   (!) 97.5 F (36.4 C)  TempSrc:    Oral  SpO2:  94%  98%  Weight:   71.7 kg   Height:        Intake/Output Summary (Last 24 hours) at 05/19/2023 1016 Last data filed at 05/18/2023 2329 Gross per 24 hour  Intake 200.43 ml  Output 900 ml  Net -699.57 ml   Filed Weights   05/17/23 2033 05/18/23 0410 05/19/23 0500  Weight: 68.9 kg 70.7 kg 71.7 kg    Examination:  General exam: Appears calm and comfortable.  On room air. Respiratory system: Bilateral decreased breath sounds at bases Cardiovascular system: S1 & S2 heard, Rate controlled Gastrointestinal system: Abdomen is nondistended, soft and nontender. Normal bowel sounds heard. Extremities: No cyanosis, clubbing, edema  Central nervous system: Alert and oriented. No focal neurological deficits. Moving extremities Skin: No rashes, lesions or ulcers Psychiatry: Flat affect.  Not agitated..     Data Reviewed: I have personally reviewed following labs and imaging studies  CBC: Recent Labs  Lab 05/17/23 1015 05/17/23 2049 05/18/23 0629 05/19/23 0535  WBC 16.5* 15.4* 12.3* 8.0  HGB 12.8* 12.5* 11.0* 11.1*  HCT 40.0 38.7* 33.6* 33.9*  MCV 84.2 83.4 82.4 84.5  PLT 197 218 190 174   Basic Metabolic Panel: Recent Labs  Lab 05/17/23 1015 05/17/23 2048 05/19/23 0535  NA 133* 134* 137  K 4.4 4.1 3.3*  CL 97* 97* 106  CO2 26 23 23   GLUCOSE 388* 411* 143*  BUN 20 19 19   CREATININE 1.82* 1.51* 1.56*  CALCIUM 8.5* 8.5* 8.0*  MG  --   --  2.2  PHOS  --   --  3.5   GFR: Estimated Creatinine Clearance: 41.6 mL/min (A) (by C-G formula based on SCr of 1.56 mg/dL (H)). Liver Function Tests: Recent Labs  Lab 05/17/23 1015  AST 15  ALT 28  ALKPHOS 84  BILITOT 0.7  PROT 7.0  ALBUMIN 3.0*   Recent Labs  Lab 05/17/23 1015  LIPASE 23   No results for input(s): "AMMONIA" in the last  168 hours. Coagulation Profile: No results for input(s): "INR", "PROTIME" in the last 168 hours. Cardiac Enzymes: No results for input(s): "CKTOTAL", "CKMB", "CKMBINDEX", "TROPONINI" in the last 168 hours. BNP (last 3 results) No results for input(s): "PROBNP" in the last 8760 hours. HbA1C: Recent Labs    05/17/23 2049  HGBA1C 11.1*   CBG: Recent Labs  Lab 05/17/23 2339 05/18/23 0734 05/18/23 1247 05/18/23 1651 05/19/23 0716  GLUCAP 256* 228* 306* 252* 136*   Lipid Profile: Recent Labs    05/17/23 2051  CHOL 97  HDL 23*  LDLCALC 49  TRIG 295  CHOLHDL 4.2   Thyroid Function Tests: Recent Labs    05/17/23 2049  TSH 1.929   Anemia Panel: No results for input(s): "VITAMINB12", "FOLATE", "FERRITIN", "TIBC", "IRON", "RETICCTPCT" in the last 72 hours. Sepsis Labs: No results for input(s): "PROCALCITON", "LATICACIDVEN" in the last 168 hours.  Recent Results (from the past 240 hour(s))  Urine Culture     Status: Abnormal   Collection Time: 05/17/23  4:01 PM   Specimen: Urine, Clean Catch  Result Value Ref Range Status   Specimen Description URINE, CLEAN CATCH  Final   Special Requests   Final    Immunocompromised Performed at [facility], [address] [place], [place] [address]    Culture MULTIPLE SPECIES PRESENT, SUGGEST RECOLLECTION (A)  Final   Report Status 05/18/2023 FINAL  Final  Culture, blood (Routine X 2) w Reflex to ID Panel     Status: None (Preliminary result)   Collection Time: 05/17/23  8:49 PM   Specimen: BLOOD  Result Value Ref Range Status   Specimen Description BLOOD BLOOD RIGHT ARM  Final   Special Requests   Final    BOTTLES DRAWN AEROBIC AND ANAEROBIC Blood Culture adequate volume   Culture   Final    NO GROWTH 2 DAYS Performed at [facility], [address], [place], [place] [address]    Report Status PENDING  Incomplete  Culture, blood (Routine X 2) w Reflex to ID Panel     Status: None (Preliminary result)    Collection Time: 05/17/23  8:51 PM   Specimen: BLOOD RIGHT HAND  Result Value Ref Range Status   Specimen Description BLOOD RIGHT HAND  Final   Special Requests   Final    BOTTLES DRAWN AEROBIC AND ANAEROBIC Blood Culture adequate volume   Culture   Final    NO GROWTH 2 DAYS Performed at [facility], [address] [place], [place] [address]    Report Status PENDING  Incomplete         Radiology Studies: CT ABDOMEN PELVIS W CONTRAST  Result Date: 05/17/2023 CLINICAL DATA:  Bilateral lower quadrant pain and right flank pain for 3 days. Dizziness. EXAM: CT ABDOMEN AND PELVIS WITH CONTRAST TECHNIQUE: Multidetector CT imaging of the abdomen and pelvis was performed using the standard protocol following bolus administration of intravenous contrast. RADIATION DOSE REDUCTION: This exam was performed according to the departmental dose-optimization program which includes automated  exposure control, adjustment of the mA and/or kV according to patient size and/or use of iterative reconstruction technique. CONTRAST:  60mL OMNIPAQUE IOHEXOL 350 MG/ML SOLN COMPARISON:  None Available. FINDINGS: Lower Chest: No acute findings. Hepatobiliary: No suspicious hepatic masses identified. Gallbladder is unremarkable. No evidence of biliary ductal dilatation. Pancreas:  No mass or inflammatory changes. Spleen: Within normal limits in size and appearance. Adrenals/Urinary Tract: Multiple ill-defined areas of decreased parenchymal contrast enhancement are seen in both kidneys, consistent with pyelonephritis. No evidence of renal mass or abscess. No evidence of ureteral calculi or hydronephrosis. Unremarkable unopacified urinary bladder. Stomach/Bowel: No evidence of obstruction, inflammatory process or abnormal fluid collections. Normal appendix visualized. Vascular/Lymphatic: No pathologically enlarged lymph nodes. No acute vascular findings. Reproductive:  No mass or other significant abnormality. Other:  None.  Musculoskeletal:  No suspicious bone lesions identified. IMPRESSION: Bilateral pyelonephritis. No evidence of renal abscess or hydronephrosis. Electronically Signed   By: Danae Orleans M.D.   On: 05/17/2023 15:19        Scheduled Meds:  atorvastatin  40 mg Oral Daily   heparin  5,000 Units Subcutaneous Q8H   insulin aspart  0-5 Units Subcutaneous QHS   insulin aspart  0-9 Units Subcutaneous TID WC   insulin glargine-yfgn  10 Units Subcutaneous Daily   insulin starter kit- pen needles  1 kit Other Once   levothyroxine  25 mcg Oral q morning   living well with diabetes book   Does not apply Once   Continuous Infusions:  cefTRIAXone (ROCEPHIN)  IV 2 g (05/19/23 0525)          Glade Lloyd, MD [facility] 05/19/2023, 10:16 AM

## 2023-05-19 NOTE — Plan of Care (Signed)

## 2023-05-19 NOTE — TOC Benefit Eligibility Note (Signed)
Patient Product/process development scientist completed.    The patient is insured through [employer] and [employer].     Ran test claim for Lantus Pen and the current 30 day co-pay is $4.00.  Ran test claim for Humalog Pen and the current 30 day co-pay is $4.00.  Ran test claim for [employer] 3 Sensor and Requires Prior Authorization  Ran test claim for [employer] and Requires Prior Authorization  This test claim was processed through [employer]- copay amounts may vary at other pharmacies due to [employer], or as the patient moves through the different stages of their insurance plan.     Roland Earl, CPHT Pharmacy Technician III Certified Patient Advocate [facility] Patient Advocate Team Direct Number: [phone]  Fax: [phone]

## 2023-05-20 DIAGNOSIS — N10 Acute tubulo-interstitial nephritis: Secondary | ICD-10-CM | POA: Diagnosis not present

## 2023-05-20 LAB — BASIC METABOLIC PANEL
Anion gap: 12 (ref 5–15)
BUN: 17 mg/dL (ref 8–23)
CO2: 22 mmol/L (ref 22–32)
Calcium: 8.2 mg/dL — ABNORMAL LOW (ref 8.9–10.3)
Chloride: 100 mmol/L (ref 98–111)
Creatinine, Ser: 1.67 mg/dL — ABNORMAL HIGH (ref 0.61–1.24)
GFR, Estimated: 45 mL/min — ABNORMAL LOW (ref >60)
Glucose, Bld: 110 mg/dL — ABNORMAL HIGH (ref 70–99)
Potassium: 3.6 mmol/L (ref 3.5–5.1)
Sodium: 134 mmol/L — ABNORMAL LOW (ref 135–145)

## 2023-05-20 LAB — CBC WITH DIFFERENTIAL/PLATELET
Abs Immature Granulocytes: 0.03 10*3/uL (ref 0.00–0.07)
Basophils Absolute: 0 10*3/uL (ref 0.0–0.1)
Basophils Relative: 0 %
Eosinophils Absolute: 0.4 10*3/uL (ref 0.0–0.5)
Eosinophils Relative: 5 %
HCT: 34.7 % — ABNORMAL LOW (ref 39.0–52.0)
Hemoglobin: 11.5 g/dL — ABNORMAL LOW (ref 13.0–17.0)
Immature Granulocytes: 0 %
Lymphocytes Relative: 19 %
Lymphs Abs: 1.3 10*3/uL (ref 0.7–4.0)
MCH: 27.8 pg (ref 26.0–34.0)
MCHC: 33.1 g/dL (ref 30.0–36.0)
MCV: 84 fL (ref 80.0–100.0)
Monocytes Absolute: 1.1 10*3/uL — ABNORMAL HIGH (ref 0.1–1.0)
Monocytes Relative: 16 %
Neutro Abs: 4.1 10*3/uL (ref 1.7–7.7)
Neutrophils Relative %: 60 %
Platelets: 219 10*3/uL (ref 150–400)
RBC: 4.13 MIL/uL — ABNORMAL LOW (ref 4.22–5.81)
RDW: 13.2 % (ref 11.5–15.5)
WBC: 7 10*3/uL (ref 4.0–10.5)
nRBC: 0 % (ref 0.0–0.2)

## 2023-05-20 LAB — GLUCOSE, CAPILLARY
Glucose-Capillary: 102 mg/dL — ABNORMAL HIGH (ref 70–99)
Glucose-Capillary: 162 mg/dL — ABNORMAL HIGH (ref 70–99)

## 2023-05-20 LAB — MAGNESIUM: Magnesium: 2.2 mg/dL (ref 1.7–2.4)

## 2023-05-20 MED ORDER — INSULIN GLARGINE 100 UNIT/ML SOLOSTAR PEN: 10.0000 [IU] | PEN_INJECTOR | Freq: Every day | SUBCUTANEOUS | 1 refills | Status: DC

## 2023-05-20 MED ORDER — BLOOD GLUCOSE MONITORING SUPPL DEVI: 1.0000 | Freq: Three times a day (TID) | 0 refills | Status: DC

## 2023-05-20 MED ORDER — PEN NEEDLES 31G X 5 MM MISC: 1.0000 | Freq: Three times a day (TID) | 0 refills | Status: DC

## 2023-05-20 MED ORDER — CEPHALEXIN 500 MG PO CAPS: 500.0000 mg | ORAL_CAPSULE | Freq: Three times a day (TID) | ORAL | 0 refills | Status: AC

## 2023-05-20 MED ORDER — INSULIN GLARGINE-YFGN 100 UNIT/ML ~~LOC~~ SOLN
15.0000 [IU] | Freq: Every day | SUBCUTANEOUS | Status: DC
  Administered 2023-05-20: 15 [IU] via SUBCUTANEOUS
  Filled 2023-05-20: qty 0.15

## 2023-05-20 MED ORDER — LANCETS MISC: 1.0000 | Freq: Three times a day (TID) | 0 refills | Status: DC

## 2023-05-20 MED ORDER — LIVING WELL WITH DIABETES BOOK
Freq: Once | Status: DC
  Filled 2023-05-20: qty 1

## 2023-05-20 MED ORDER — LANCET DEVICE MISC: 1.0000 | Freq: Three times a day (TID) | 0 refills | Status: DC

## 2023-05-20 MED ORDER — BLOOD GLUCOSE TEST VI STRP: 1.0000 | ORAL_STRIP | Freq: Three times a day (TID) | 0 refills | Status: DC

## 2023-05-20 MED ORDER — INSULIN ASPART 100 UNIT/ML FLEXPEN: 0.0000 [IU] | PEN_INJECTOR | Freq: Three times a day (TID) | SUBCUTANEOUS | 1 refills | Status: DC

## 2023-05-20 NOTE — Discharge Summary (Signed)
Physician Discharge Summary  Nicholas Johnston MVH:[id number] DOB: Mar 18, 1956 DOA: 05/17/2023  PCP: Pcp, No  Admit date: 05/17/2023 Discharge date: 05/20/2023  Admitted From: Home Disposition: Home  Recommendations for Outpatient Follow-up:  Follow up with PCP in 1 week with repeat CBC/BMP Follow up in ED if symptoms worsen or new appear   Home Health: No Equipment/Devices: None  Discharge Condition: Stable CODE STATUS: Full Diet recommendation: Heart healthy/carb modified  Brief/Interim Summary: 67 years old male with PMH significant for DM2, hypertension presented with fever, chills, dysuria and suprapubic pain.  On presentation, WBC was 16,000, UA suggestive of UTI.  CT of abdomen and pelvis showed bilateral pyelonephritis without renal abscess or hydronephrosis.  He was started on IV fluids and antibiotics.  During the hospitalization, his condition has improved.  He is currently afebrile and leukocytosis has resolved.  Urine cultures grew multiple species.  He will be discharged home today on oral Keflex.  Discharge Diagnoses:   Sepsis: Present on admission Acute bilateral pyelonephritis -Presented with hypotension, tachycardia, leukocytosis, bilateral pyelonephritis -No temperature spikes over the last 4 hours. -Urine culture grew multiple species.  Blood cultures negative so far.  Currently on Rocephin.  Off IV fluids. -Atypical.  Discharge home on oral Keflex for 10 more days.  Outpatient follow-up with PCP.   Leukocytosis -Resolved   AKI -Unknown baseline.  Creatinine 1.82 on presentation.  Treated with IV fluids.  Creatinine 1.67 today.  Follow-up as an outpatient.   Diabetes mellitus type 2 uncontrolled with hyperglycemia -Recent A1c 11.1.  Continue long-acting insulin 10 units daily on discharge along with NovoLog sensitive sliding scale coverage.  Carb modified diet.  Outpatient follow-up with PCP.  Diabetes coordinator follow-up appreciated.    Hypothyroidism -Continue  levothyroxine   Hyperlipidemia -Continue statin   Hyponatremia -Mild. Outpatient follow up   Hypokalemia -Improved   Normocytic anemia -Questionable cause.  Hemoglobin stable.  Monitor intermittently as an outpatient.  Discharge Instructions   Allergies as of 05/20/2023   No Known Allergies      Medication List     STOP taking these medications    metFORMIN 1000 MG tablet Commonly known as: GLUCOPHAGE       TAKE these medications    acetaminophen 500 MG tablet Commonly known as: TYLENOL Take 1,000 mg by mouth 2 (two) times daily as needed for moderate pain, fever or headache.   atorvastatin 40 MG tablet Commonly known as: LIPITOR Take 40 mg by mouth daily.   Blood Glucose Monitoring Suppl Devi 1 each by Does not apply route 3 (three) times daily. May dispense any manufacturer covered by patient's insurance.   BLOOD GLUCOSE TEST STRIPS Strp 1 each by Does not apply route 3 (three) times daily. Use as directed to check blood sugar. May dispense any manufacturer covered by patient's insurance and fits patient's device.   cephALEXin 500 MG capsule Commonly known as: KEFLEX Take 1 capsule (500 mg total) by mouth 3 (three) times daily for 10 days. Start taking on: May 21, 2023   insulin aspart 100 UNIT/ML FlexPen Commonly known as: NOVOLOG Inject 0-6 Units into the skin 3 (three) times daily with meals. Check Blood Glucose (BG) and inject per scale: BG <150= 0 unit; BG 150-200= 1 unit; BG 201-250= 2 unit; BG 251-300= 3 unit; BG 301-350= 4 unit; BG 351-400= 5 unit; BG >400= 6 unit and Call Primary Care.   insulin glargine 100 UNIT/ML Solostar Pen Commonly known as: LANTUS Inject 10 Units into the skin daily. Start  taking on: May 21, 2023   Lancet Device Misc 1 each by Does not apply route 3 (three) times daily. May dispense any manufacturer covered by patient's insurance.   Lancets Misc 1 each by Does not apply route 3 (three) times daily. Use as  directed to check blood sugar. May dispense any manufacturer covered by patient's insurance and fits patient's device.   levothyroxine 25 MCG tablet Commonly known as: SYNTHROID Take 25 mcg by mouth every morning.   Pen Needles 31G X 5 MM Misc 1 each by Does not apply route 3 (three) times daily. May dispense any manufacturer covered by patient's insurance.         Follow-up Information     [facility] Follow up on 05/26/2023.   Specialty: Internal Medicine Why: You are scheduled for a hospital follow up on 05/26/2023 at 2:20 pm. Contact information: [address] [place] [address] [phone]               No Known Allergies  Consultations: None   Procedures/Studies: CT ABDOMEN PELVIS W CONTRAST  Result Date: 05/17/2023 CLINICAL DATA:  Bilateral lower quadrant pain and right flank pain for 3 days. Dizziness. EXAM: CT ABDOMEN AND PELVIS WITH CONTRAST TECHNIQUE: Multidetector CT imaging of the abdomen and pelvis was performed using the standard protocol following bolus administration of intravenous contrast. RADIATION DOSE REDUCTION: This exam was performed according to the departmental dose-optimization program which includes automated exposure control, adjustment of the mA and/or kV according to patient size and/or use of iterative reconstruction technique. CONTRAST:  60mL OMNIPAQUE IOHEXOL 350 MG/ML SOLN COMPARISON:  None Available. FINDINGS: Lower Chest: No acute findings. Hepatobiliary: No suspicious hepatic masses identified. Gallbladder is unremarkable. No evidence of biliary ductal dilatation. Pancreas:  No mass or inflammatory changes. Spleen: Within normal limits in size and appearance. Adrenals/Urinary Tract: Multiple ill-defined areas of decreased parenchymal contrast enhancement are seen in both kidneys, consistent with pyelonephritis. No evidence of renal mass or abscess. No evidence of ureteral calculi or hydronephrosis.  Unremarkable unopacified urinary bladder. Stomach/Bowel: No evidence of obstruction, inflammatory process or abnormal fluid collections. Normal appendix visualized. Vascular/Lymphatic: No pathologically enlarged lymph nodes. No acute vascular findings. Reproductive:  No mass or other significant abnormality. Other:  None. Musculoskeletal:  No suspicious bone lesions identified. IMPRESSION: Bilateral pyelonephritis. No evidence of renal abscess or hydronephrosis. Electronically Signed   By: Danae Orleans M.D.   On: 05/17/2023 15:19      Subjective: Patient seen and examined at bedside.  No fever, vomiting, chest pain reported.  Feels very cold.  Daughter at bedside translates for the patient.  Discharge Exam: Vitals:   05/20/23 0335 05/20/23 0733  BP: 126/75 (!) 124/90  Pulse: 83 79  Resp:    Temp: 99.4 F (37.4 C) 99.3 F (37.4 C)  SpO2: 97% 96%    General: Pt is alert, awake, not in acute distress.  Currently on room air. Cardiovascular: rate controlled, S1/S2 + Respiratory: bilateral decreased breath sounds at bases Abdominal: Soft, NT, ND, bowel sounds + Extremities: no edema, no cyanosis    The results of significant diagnostics from this hospitalization (including imaging, microbiology, ancillary and laboratory) are listed below for reference.     Microbiology: Recent Results (from the past 240 hour(s))  Urine Culture     Status: Abnormal   Collection Time: 05/17/23  4:01 PM   Specimen: Urine, Clean Catch  Result Value Ref Range Status   Specimen Description URINE,  CLEAN CATCH  Final   Special Requests   Final    Immunocompromised Performed at [facility], [address] [place], [place] [address]    Culture MULTIPLE SPECIES PRESENT, SUGGEST RECOLLECTION (A)  Final   Report Status 05/18/2023 FINAL  Final  Culture, blood (Routine X 2) w Reflex to ID Panel     Status: None (Preliminary result)   Collection Time: 05/17/23  8:49 PM   Specimen: BLOOD  Result Value  Ref Range Status   Specimen Description BLOOD BLOOD RIGHT ARM  Final   Special Requests   Final    BOTTLES DRAWN AEROBIC AND ANAEROBIC Blood Culture adequate volume   Culture   Final    NO GROWTH 2 DAYS Performed at [facility], [address], [place], [place] [address]    Report Status PENDING  Incomplete  Culture, blood (Routine X 2) w Reflex to ID Panel     Status: None (Preliminary result)   Collection Time: 05/17/23  8:51 PM   Specimen: BLOOD RIGHT HAND  Result Value Ref Range Status   Specimen Description BLOOD RIGHT HAND  Final   Special Requests   Final    BOTTLES DRAWN AEROBIC AND ANAEROBIC Blood Culture adequate volume   Culture   Final    NO GROWTH 2 DAYS Performed at [facility], [address] [place], [place] [address]    Report Status PENDING  Incomplete     Labs: BNP (last 3 results) No results for input(s): "BNP" in the last 8760 hours. Basic Metabolic Panel: Recent Labs  Lab 05/17/23 1015 05/17/23 2048 05/19/23 0535 05/20/23 0611  NA 133* 134* 137 134*  K 4.4 4.1 3.3* 3.6  CL 97* 97* 106 100  CO2 26 23 23 22   GLUCOSE 388* 411* 143* 110*  BUN 20 19 19 17   CREATININE 1.82* 1.51* 1.56* 1.67*  CALCIUM 8.5* 8.5* 8.0* 8.2*  MG  --   --  2.2 2.2  PHOS  --   --  3.5  --    Liver Function Tests: Recent Labs  Lab 05/17/23 1015  AST 15  ALT 28  ALKPHOS 84  BILITOT 0.7  PROT 7.0  ALBUMIN 3.0*   Recent Labs  Lab 05/17/23 1015  LIPASE 23   No results for input(s): "AMMONIA" in the last 168 hours. CBC: Recent Labs  Lab 05/17/23 1015 05/17/23 2049 05/18/23 0629 05/19/23 0535 05/20/23 0611  WBC 16.5* 15.4* 12.3* 8.0 7.0  NEUTROABS  --   --   --   --  4.1  HGB 12.8* 12.5* 11.0* 11.1* 11.5*  HCT 40.0 38.7* 33.6* 33.9* 34.7*  MCV 84.2 83.4 82.4 84.5 84.0  PLT 197 218 190 174 219   Cardiac Enzymes: No results for input(s): "CKTOTAL", "CKMB", "CKMBINDEX", "TROPONINI" in the last 168 hours. BNP: Invalid input(s):  "POCBNP" CBG: Recent Labs  Lab 05/19/23 0716 05/19/23 1259 05/19/23 1636 05/19/23 1935 05/20/23 0734  GLUCAP 136* 177* 313* 247* 102*   D-Dimer No results for input(s): "DDIMER" in the last 72 hours. Hgb A1c Recent Labs    05/17/23 2049  HGBA1C 11.1*   Lipid Profile Recent Labs    05/17/23 2051  CHOL 97  HDL 23*  LDLCALC 49  TRIG 914  CHOLHDL 4.2   Thyroid function studies Recent Labs    05/17/23 2049  TSH 1.929   Anemia work up No results for input(s): "VITAMINB12", "FOLATE", "FERRITIN", "TIBC", "IRON", "RETICCTPCT" in the last 72  hours. Urinalysis    Component Value Date/Time   COLORURINE YELLOW 05/17/2023 2134   APPEARANCEUR CLEAR 05/17/2023 2134   LABSPEC 1.027 05/17/2023 2134   PHURINE 5.0 05/17/2023 2134   GLUCOSEU >=500 (A) 05/17/2023 2134   HGBUR MODERATE (A) 05/17/2023 2134   BILIRUBINUR NEGATIVE 05/17/2023 2134   KETONESUR 5 (A) 05/17/2023 2134   PROTEINUR 30 (A) 05/17/2023 2134   NITRITE NEGATIVE 05/17/2023 2134   LEUKOCYTESUR NEGATIVE 05/17/2023 2134   Sepsis Labs Recent Labs  Lab 05/17/23 2049 05/18/23 0629 05/19/23 0535 05/20/23 0611  WBC 15.4* 12.3* 8.0 7.0   Microbiology Recent Results (from the past 240 hour(s))  Urine Culture     Status: Abnormal   Collection Time: 05/17/23  4:01 PM   Specimen: Urine, Clean Catch  Result Value Ref Range Status   Specimen Description URINE, CLEAN CATCH  Final   Special Requests   Final    Immunocompromised Performed at [facility], [address] [place], [place] [address]    Culture MULTIPLE SPECIES PRESENT, SUGGEST RECOLLECTION (A)  Final   Report Status 05/18/2023 FINAL  Final  Culture, blood (Routine X 2) w Reflex to ID Panel     Status: None (Preliminary result)   Collection Time: 05/17/23  8:49 PM   Specimen: BLOOD  Result Value Ref Range Status   Specimen Description BLOOD BLOOD RIGHT ARM  Final   Special Requests   Final    BOTTLES DRAWN AEROBIC AND ANAEROBIC Blood  Culture adequate volume   Culture   Final    NO GROWTH 2 DAYS Performed at [facility], [address] [place], [place] [address]    Report Status PENDING  Incomplete  Culture, blood (Routine X 2) w Reflex to ID Panel     Status: None (Preliminary result)   Collection Time: 05/17/23  8:51 PM   Specimen: BLOOD RIGHT HAND  Result Value Ref Range Status   Specimen Description BLOOD RIGHT HAND  Final   Special Requests   Final    BOTTLES DRAWN AEROBIC AND ANAEROBIC Blood Culture adequate volume   Culture   Final    NO GROWTH 2 DAYS Performed at [facility], [address], [place], [place] [address]    Report Status PENDING  Incomplete     Time coordinating discharge: 35 minutes  SIGNED:   Glade Lloyd, MD  [facility] 05/20/2023, 9:58 AM

## 2023-05-20 NOTE — Inpatient Diabetes Management (Addendum)
Inpatient Diabetes Program Recommendations  AACE/ADA: New Consensus Statement on Inpatient Glycemic Control (2015)  Target Ranges:  Prepandial:   less than 140 mg/dL      Peak postprandial:   less than 180 mg/dL (1-2 hours)      Critically ill patients:  140 - 180 mg/dL   Lab Results  Component Value Date   GLUCAP 102 (H) 05/20/2023   HGBA1C 11.1 (H) 05/17/2023    Review of Glycemic Control  Latest Reference Range & Units 05/19/23 07:16 05/19/23 12:59 05/19/23 16:36 05/19/23 19:35 05/20/23 07:34  Glucose-Capillary 70 - 99 mg/dL 401 (H) 027 (H) 253 (H) 247 (H) 102 (H)   Diabetes history: DM Outpatient Diabetes medications: Metformin- not taking Current orders for Inpatient glycemic control:  Novolog 0-9 units tid with meals and HS Semglee 15 units daily  Inpatient Diabetes Program Recommendations:    Discharge Recommendations: Long acting recommendations: Insulin Glargine (LANTUS) Solostar Pen 10 units daily  Short acting recommendations:  Correction coverage ONLY Insulin aspart (NOVOLOG) FlexPen  Sensitive Scale.   Supply/Referral recommendations: Pen needles - standard Test strips Lancets Glucometer Lancet device   Use Adult Diabetes Insulin Treatment Post Discharge order set.  Spoke with patient's daughter to make sure that she understood insulins, monitoring, hypoglycemia, etc.  Encouraged her to check blood sugars prior to meals and write down/keep track of medications and insulins/CBG's.  Reviewed hypoglycemia signs, symptoms and treatment.  Encouraged patient to eat more protein and leafy green veggies. Also encouraged close follow up with MD as patient will need ongoing treatment.  Daughter speaks fluent English and was interested in further reading material on DM.   Thanks,  Beryl Meager, RN, BC-ADM Inpatient Diabetes Coordinator Pager [phone]  (8a-5p)

## 2023-05-20 NOTE — Progress Notes (Signed)
Discharge instructions reviewed with pt and his daughter. Copy of instructions given to pt/daughter. Read over all instructions, education handouts provided on diabetes, insulin administration, correction insulin, insulin pen injection instructions, hypoglycemia, glucose monitoring, and preventing complications. Pt/daughter informed to pick up scripts at his pharmacy.  Pt/daughter verbalized understanding of instructions and able to teach back.  Pt's daughter states she asked MD for a work Teacher, music. Message sent to MD requesting this note. Await response.   Pt d/c'd via wheelchair with belongings, with daughter.           To be escorted by hospital volunteer/staff.   At 1450, MD has placed work note in [employer], will print and give to pt.  Annice Needy, RN SWOT

## 2023-05-20 NOTE — Plan of Care (Signed)

## 2023-05-22 LAB — CULTURE, BLOOD (ROUTINE X 2)
Culture: NO GROWTH
Culture: NO GROWTH
Special Requests: ADEQUATE
Special Requests: ADEQUATE

## 2023-05-24 ENCOUNTER — Telehealth: Payer: Self-pay

## 2023-05-24 NOTE — Transitions of Care (Post Inpatient/ED Visit) (Signed)
05/24/2023  Name: Nicholas Johnston MRN: [id number] DOB: 08-18-1955  Today's TOC FU Call Status: Today's TOC FU Call Status:: Successful TOC FU Call Completed TOC FU Call Complete Date: 05/24/23 Patient's Name and Date of Birth confirmed.  Transition Care Management Follow-up Telephone Call Date of Discharge: 05/20/23 Discharge Facility: [facility]) Type of Discharge: Inpatient Admission Primary Inpatient Discharge Diagnosis:: acute bilateral pyelonephritis How have you been since you were released from the hospital?: Better Any questions or concerns?: No  Items Reviewed: Did you receive and understand the discharge instructions provided?: Yes Medications obtained,verified, and reconciled?: Yes (Medications Reviewed) Any new allergies since your discharge?: No Dietary orders reviewed?: Yes Type of Diet Ordered:: Low sodium, Heart healthy, Carb modified (A1C = 11.1 and is on insulin coverage) Do you have support at home?: Yes People in Home: child(ren), adult  Medications Reviewed Today: Medications Reviewed Today     Reviewed by Marcos Eke, RN (Registered Nurse) on 05/24/23 at 1202  Med List Status: <None>   Medication Order Taking? Sig Documenting Provider Last Dose Status Informant  acetaminophen (TYLENOL) 500 MG tablet [id number]  Take 1,000 mg by mouth 2 (two) times daily as needed for moderate pain, fever or headache. [provider]  Active Self, Family Member  atorvastatin (LIPITOR) 40 MG tablet [id number]  Take 40 mg by mouth daily. [provider]  Active Self, Family Member, Pharmacy Records  Blood Glucose Monitoring Suppl DEVI [id number]  1 each by Does not apply route 3 (three) times daily. May dispense any manufacturer covered by patient's insurance. Glade Lloyd, MD  Active   cephALEXin (KEFLEX) 500 MG capsule [id number] Yes Take 1 capsule (500 mg total) by mouth 3 (three) times daily for 10 days. Glade Lloyd, MD Taking Active   Glucose Blood  (BLOOD GLUCOSE TEST STRIPS) STRP [id number]  1 each by Does not apply route 3 (three) times daily. Use as directed to check blood sugar. May dispense any manufacturer covered by patient's insurance and fits patient's device. Glade Lloyd, MD  Active   insulin aspart (NOVOLOG) 100 UNIT/ML FlexPen [id number]  Inject 0-6 Units into the skin 3 (three) times daily with meals. Check Blood Glucose (BG) and inject per scale: BG <150= 0 unit; BG 150-200= 1 unit; BG 201-250= 2 unit; BG 251-300= 3 unit; BG 301-350= 4 unit; BG 351-400= 5 unit; BG >400= 6 unit and Call Primary Care. Glade Lloyd, MD  Active   insulin glargine (LANTUS) 100 UNIT/ML Solostar Pen [id number]  Inject 10 Units into the skin daily. Glade Lloyd, MD  Active   Insulin Pen Needle (PEN NEEDLES) 31G X 5 MM MISC [id number]  1 each by Does not apply route 3 (three) times daily. May dispense any manufacturer covered by patient's insurance. Glade Lloyd, MD  Active   Lancet Device MISC [id number]  1 each by Does not apply route 3 (three) times daily. May dispense any manufacturer covered by patient's insurance. Glade Lloyd, MD  Active   Lancets MISC [id number]  1 each by Does not apply route 3 (three) times daily. Use as directed to check blood sugar. May dispense any manufacturer covered by patient's insurance and fits patient's device. Glade Lloyd, MD  Active   levothyroxine (SYNTHROID) 25 MCG tablet [id number]  Take 25 mcg by mouth every morning. [provider]  Active Self, Family Member, Pharmacy Records            Home Care and Equipment/Supplies: Were Home Health Services Ordered?: No Any new equipment  or medical supplies ordered?: No  Functional Questionnaire: Do you need assistance with bathing/showering or dressing?: No Do you need assistance with meal preparation?: No Do you need assistance with eating?: No Do you have difficulty maintaining continence: No Do you need assistance with getting out of bed/getting  out of a chair/moving?: No Do you have difficulty managing or taking your medications?: No  Follow up appointments reviewed: PCP Follow-up appointment confirmed?: Yes MD Provider Line Number:[phone] Given: No Date of PCP follow-up appointment?: 05/26/23 Follow-up Provider: Edwin Dada (appt to establish care) [facility]ist Hospital Follow-up appointment confirmed?: NA Do you need transportation to your follow-up appointment?: No Do you understand care options if your condition(s) worsen?: Yes-patient verbalized understanding    Alyse Low, RN, BA, [place], CRRN [facility] Population Health Care Management Coordinator, Transition of Care Ph # [phone]

## 2023-05-26 ENCOUNTER — Encounter: Payer: Self-pay | Admitting: Nurse Practitioner

## 2023-05-26 ENCOUNTER — Other Ambulatory Visit (HOSPITAL_COMMUNITY): Payer: Self-pay

## 2023-05-26 ENCOUNTER — Ambulatory Visit: Payer: BC Managed Care – PPO | Admitting: Nurse Practitioner

## 2023-05-26 VITALS — BP 104/57 | HR 83 | Ht 65.0 in | Wt 141.0 lb

## 2023-05-26 DIAGNOSIS — E039 Hypothyroidism, unspecified: Secondary | ICD-10-CM

## 2023-05-26 DIAGNOSIS — Z09 Encounter for follow-up examination after completed treatment for conditions other than malignant neoplasm: Secondary | ICD-10-CM | POA: Insufficient documentation

## 2023-05-26 DIAGNOSIS — E785 Hyperlipidemia, unspecified: Secondary | ICD-10-CM | POA: Diagnosis not present

## 2023-05-26 DIAGNOSIS — Z1211 Encounter for screening for malignant neoplasm of colon: Secondary | ICD-10-CM

## 2023-05-26 DIAGNOSIS — E1165 Type 2 diabetes mellitus with hyperglycemia: Secondary | ICD-10-CM

## 2023-05-26 DIAGNOSIS — N10 Acute tubulo-interstitial nephritis: Secondary | ICD-10-CM | POA: Diagnosis not present

## 2023-05-26 DIAGNOSIS — G4700 Insomnia, unspecified: Secondary | ICD-10-CM | POA: Insufficient documentation

## 2023-05-26 HISTORY — DX: Type 2 diabetes mellitus with hyperglycemia: E11.65

## 2023-05-26 HISTORY — DX: Insomnia, unspecified: G47.00

## 2023-05-26 MED ORDER — MELATONIN 5 MG PO TABS
5.0000 mg | ORAL_TABLET | Freq: Every evening | ORAL | 0 refills | Status: DC | PRN
  Filled 2023-05-26: qty 60, 60d supply, fill #0

## 2023-05-26 MED ORDER — INSULIN LISPRO (1 UNIT DIAL) 100 UNIT/ML (KWIKPEN)
0.0000 [IU] | PEN_INJECTOR | Freq: Three times a day (TID) | SUBCUTANEOUS | 1 refills | Status: DC
  Filled 2023-05-26: qty 3, 17d supply, fill #0
  Filled 2023-05-26: qty 3, 16d supply, fill #0

## 2023-05-26 MED ORDER — INSULIN GLARGINE-YFGN 100 UNIT/ML ~~LOC~~ SOPN
10.0000 [IU] | PEN_INJECTOR | Freq: Every day | SUBCUTANEOUS | 1 refills | Status: DC
  Filled 2023-05-26 (×2): qty 3, 30d supply, fill #0

## 2023-05-26 NOTE — Assessment & Plan Note (Signed)
Continue levothyroxine 25 mcg daily Lab Results  Component Value Date   TSH 1.929 05/17/2023

## 2023-05-26 NOTE — Assessment & Plan Note (Addendum)
Now resolved Continue Keflex 500 mg 3 times daily Rechecking BMP

## 2023-05-26 NOTE — Patient Instructions (Addendum)
Please consider getting  and Tdap vaccine at local pharmacy.    Goal for fasting blood sugar ranges from 80 to 120 and 2 hours after any meal or at bedtime should be between 130 to 170.   Uncontrolled type 2 diabetes mellitus with hyperglycemia (HCC)  - insulin aspart (NOVOLOG) 100 UNIT/ML FlexPen; Inject 0-6 Units into the skin 3 (three) times daily with meals. Check Blood Glucose (BG) and inject per scale: BG <150= 0 unit; BG 150-200= 1 unit; BG 201-250= 2 unit; BG 251-300= 3 unit; BG 301-350= 4 unit; BG 351-400= 5 unit; BG >400= 6 unit and Call Primary Care.  Dispense: 3 mL; Refill: 1 - insulin glargine (LANTUS) 100 UNIT/ML Solostar Pen; Inject 10 Units into the skin daily.  Dispense: 3 mL; Refill: 1   . Screening for colon cancer  - Cologuard    Please maintain simple sleep hygiene. - Maintain dark and non-noisy environment in the bedroom. - Please use the bedroom for sleep and sexual activity only. - Do not use electronic devices in the bedroom. - Please take dinner at least 2 hours before bedtime. - Please avoid caffeinated products in the evening, including coffee, soft drinks. - Please try to maintain the regular sleep-wake cycle - Go to bed and wake up at the same time.     It is important that you exercise regularly at least 30 minutes 5 times a week as tolerated  Think about what you will eat, plan ahead. Choose " clean, green, fresh or frozen" over canned, processed or packaged foods which are more sugary, salty and fatty. 70 to 75% of food eaten should be vegetables and fruit. Three meals at set times with snacks allowed between meals, but they must be fruit or vegetables. Aim to eat over a 12 hour period , example 7 am to 7 pm, and STOP after  your last meal of the day. Drink water,generally about 64 ounces per day, no other drink is as healthy. Fruit juice is best enjoyed in a healthy way, by EATING the fruit.  [facility] for choosing [facility] we consider it a  privelige to serve you.

## 2023-05-26 NOTE — Assessment & Plan Note (Signed)
Lab Results  Component Value Date   HGBA1C 11.1 (H) 05/17/2023    - insulin lispro (HUMALOG) 100 UNIT/ML KwikPen; Inject 0-6 Units into the skin 3 (three) times daily with meals. Check Blood Glucose (BG) and inject per scale: BG <150= 0 unit; BG 150-200= 1 unit; BG 201-250= 2 unit; BG 251-300= 3 unit; BG 301-350= 4 unit; BG 351-400= 5 unit; BG >400= 6 unit and Call Primary Care.  Dispense: 3 mL; Refill: 1 - insulin glargine-yfgn (SEMGLEE, YFGN,) 100 UNIT/ML Pen; Inject 10 Units into the skin daily.  Dispense: 3 mL; Refill: 1 Patient counseled on low-carb modified diet Encouraged engage in regular moderate exercise at least 150 minutes weekly Follow-up in 4 weeks Up-to-date with urine microalbumin labs   ALBUMIN, URINE 0.7 mg/dL      ALBUMIN/CREATININE RATIO, RANDOM URINE 10 mg/g creat      CREATININE, RANDOM URINE 72 mg/dL      TSH + FREE T4, SERUM Reviewed date:01/02/2023 12:00:00 AM

## 2023-05-26 NOTE — Assessment & Plan Note (Addendum)
For months, gets 2 hours, does not feel well rested when he gets up in the morning.  Snores when he sleeps  Patient declined sleep study Sleep hygiene discussed Start melatonin 5 mg at bedtime as needed

## 2023-05-26 NOTE — Assessment & Plan Note (Signed)
Lab Results  Component Value Date   CHOL 97 05/17/2023   HDL 23 (L) 05/17/2023   LDLCALC 49 05/17/2023   TRIG 123 05/17/2023   CHOLHDL 4.2 05/17/2023  Well-controlled on atorvastatin 40 mg daily Continue current medication Avoid fatty fried foods

## 2023-05-26 NOTE — Assessment & Plan Note (Signed)
Hospital chart reviewed, including discharge summary Medications reconciled and reviewed with the patient in detail 

## 2023-05-27 ENCOUNTER — Other Ambulatory Visit (HOSPITAL_COMMUNITY): Payer: Self-pay

## 2023-05-27 LAB — BASIC METABOLIC PANEL
BUN/Creatinine Ratio: 17 (ref 10–24)
BUN: 27 mg/dL (ref 8–27)
CO2: 22 mmol/L (ref 20–29)
Calcium: 8.8 mg/dL (ref 8.6–10.2)
Chloride: 96 mmol/L (ref 96–106)
Creatinine, Ser: 1.63 mg/dL — ABNORMAL HIGH (ref 0.76–1.27)
Glucose: 446 mg/dL — ABNORMAL HIGH (ref 70–99)
Potassium: 4.7 mmol/L (ref 3.5–5.2)
Sodium: 133 mmol/L — ABNORMAL LOW (ref 134–144)
eGFR: 47 mL/min/{1.73_m2} — ABNORMAL LOW (ref >59)

## 2023-06-04 DIAGNOSIS — H25013 Cortical age-related cataract, bilateral: Secondary | ICD-10-CM | POA: Diagnosis not present

## 2023-06-04 DIAGNOSIS — H25043 Posterior subcapsular polar age-related cataract, bilateral: Secondary | ICD-10-CM | POA: Diagnosis not present

## 2023-06-04 DIAGNOSIS — H35371 Puckering of macula, right eye: Secondary | ICD-10-CM | POA: Diagnosis not present

## 2023-06-04 DIAGNOSIS — H2513 Age-related nuclear cataract, bilateral: Secondary | ICD-10-CM | POA: Diagnosis not present

## 2023-06-18 DIAGNOSIS — Z1211 Encounter for screening for malignant neoplasm of colon: Secondary | ICD-10-CM | POA: Diagnosis not present

## 2023-06-23 ENCOUNTER — Ambulatory Visit (INDEPENDENT_AMBULATORY_CARE_PROVIDER_SITE_OTHER): Payer: BC Managed Care – PPO | Admitting: Nurse Practitioner

## 2023-06-23 ENCOUNTER — Encounter: Payer: Self-pay | Admitting: Nurse Practitioner

## 2023-06-23 VITALS — BP 116/79 | HR 74 | Temp 98.5°F | Resp 14 | Ht 65.0 in | Wt 141.0 lb

## 2023-06-23 DIAGNOSIS — E1165 Type 2 diabetes mellitus with hyperglycemia: Secondary | ICD-10-CM | POA: Diagnosis not present

## 2023-06-23 DIAGNOSIS — R7989 Other specified abnormal findings of blood chemistry: Secondary | ICD-10-CM | POA: Diagnosis not present

## 2023-06-23 MED ORDER — INSULIN GLARGINE-YFGN 100 UNIT/ML ~~LOC~~ SOPN
14.0000 [IU] | PEN_INJECTOR | Freq: Every day | SUBCUTANEOUS | 1 refills | Status: DC
Start: 2023-06-23 — End: 2023-08-23

## 2023-06-23 NOTE — Assessment & Plan Note (Addendum)
Lab Results  Component Value Date   NA 133 (L) 05/26/2023   K 4.7 05/26/2023   CO2 22 05/26/2023   GLUCOSE 446 (H) 05/26/2023   BUN 27 05/26/2023   CREATININE 1.63 (H) 05/26/2023   CALCIUM 8.8 05/26/2023   EGFR 47 (L) 05/26/2023   GFRNONAA 45 (L) 05/20/2023  Patient will benefit from addition of an SGLT2 for kidney protection but he recently had pyelonephritis Patient referred to the clinical pharmacist to assist with medication management

## 2023-06-23 NOTE — Progress Notes (Signed)
Established Patient Office Visit  Subjective:  Patient ID: Nicholas Johnston, male    DOB: 1956/08/15  Age: 67 y.o. MRN: [id number]  CC:  Chief Complaint  Patient presents with   Follow-up    HPI Nicholas Johnston is a 67 y.o. male  has a past medical history of Acute pyelonephritis (05/17/2023), Diabetes mellitus without complication (HCC), Dyslipidemia, goal LDL below 70 (05/26/2023), Hyperlipidemia, Insomnia (05/26/2023), and Uncontrolled type 2 diabetes mellitus with hyperglycemia (HCC) (05/26/2023).    Uncontrolled type 2 diabetes patient presents for follow-up for type 2 diabetes.  Currently on sliding scale NovoLog and semglee 10 units daily, stated that he has been taking both medications daily as ordered, stated that he has been following a low-carb modified diet and does get a lot of walking done at work.  Fasting blood sugar has been between 150s to 300s.  Patient denies any adverse reactions to current medications  Interpretation services provided via  iPad Patient is accompanied by his daughter   Past Medical History:  Diagnosis Date   Acute pyelonephritis 05/17/2023   Diabetes mellitus without complication (HCC)    Dyslipidemia, goal LDL below 70 05/26/2023   Hyperlipidemia    Insomnia 05/26/2023   Uncontrolled type 2 diabetes mellitus with hyperglycemia (HCC) 05/26/2023    No past surgical history on file.  Family History  Family history unknown: Yes    Social History   Socioeconomic History   Marital status: Married    Spouse name: Sa Nay   Number of children: 3   Years of education: Not on file   Highest education level: Not on file  Occupational History   Not on file  Tobacco Use   Smoking status: Never   Smokeless tobacco: Never  Vaping Use   Vaping status: Never Used  Substance and Sexual Activity   Alcohol use: Not Currently    Comment: occ   Drug use: No   Sexual activity: Not Currently  Other Topics Concern   Not on file  Social History  Narrative   Lives with his family    Social Determinants of Health   Financial Resource Strain: Not on file  Food Insecurity: No Food Insecurity (05/24/2023)   Hunger Vital Sign    Worried About Running Out of Food in the Last Year: Never true    Ran Out of Food in the Last Year: Never true  Transportation Needs: No Transportation Needs (05/24/2023)   PRAPARE - Administrator, Civil Service (Medical): No    Lack of Transportation (Non-Medical): No  Physical Activity: Not on file  Stress: Not on file  Social Connections: Not on file  Intimate Partner Violence: Not At Risk (05/17/2023)   Humiliation, Afraid, Rape, and Kick questionnaire    Fear of Current or Ex-Partner: No    Emotionally Abused: No    Physically Abused: No    Sexually Abused: No    Outpatient Medications Prior to Visit  Medication Sig Dispense Refill   acetaminophen (TYLENOL) 500 MG tablet Take 1,000 mg by mouth 2 (two) times daily as needed for moderate pain, fever or headache.     atorvastatin (LIPITOR) 40 MG tablet Take 40 mg by mouth daily.     Blood Glucose Monitoring Suppl DEVI 1 each by Does not apply route 3 (three) times daily. May dispense any manufacturer covered by patient's insurance. 1 each 0   Glucose Blood (BLOOD GLUCOSE TEST STRIPS) STRP 1 each by Does not apply route 3 (three)  times daily. Use as directed to check blood sugar. May dispense any manufacturer covered by patient's insurance and fits patient's device. 100 strip 0   insulin lispro (HUMALOG) 100 UNIT/ML KwikPen Inject 0-6 Units into the skin 3 (three) times daily with meals. Check Blood Glucose (BG) and inject per scale: BG <150= 0 unit; BG 150-200= 1 unit; BG 201-250= 2 unit; BG 251-300= 3 unit; BG 301-350= 4 unit; BG 351-400= 5 unit; BG >400= 6 unit and Call Primary Care. 3 mL 1   Insulin Pen Needle (PEN NEEDLES) 31G X 5 MM MISC 1 each by Does not apply route 3 (three) times daily. May dispense any manufacturer covered by patient's  insurance. 100 each 0   Lancet Device MISC 1 each by Does not apply route 3 (three) times daily. May dispense any manufacturer covered by patient's insurance. 1 each 0   Lancets MISC 1 each by Does not apply route 3 (three) times daily. Use as directed to check blood sugar. May dispense any manufacturer covered by patient's insurance and fits patient's device. 100 each 0   levothyroxine (SYNTHROID) 25 MCG tablet Take 25 mcg by mouth every morning.     insulin glargine-yfgn (SEMGLEE, YFGN,) 100 UNIT/ML Pen Inject 10 Units into the skin daily. 3 mL 1   melatonin 5 MG TABS Take 1 tablet (5 mg total) by mouth at bedtime as needed. (Patient not taking: Reported on 06/23/2023) 60 tablet 0   No facility-administered medications prior to visit.    No Known Allergies  ROS Review of Systems  Constitutional:  Negative for appetite change, chills, fatigue and fever.  HENT:  Negative for congestion, postnasal drip, rhinorrhea and sneezing.   Respiratory:  Negative for cough, shortness of breath and wheezing.   Cardiovascular:  Negative for chest pain, palpitations and leg swelling.  Gastrointestinal:  Negative for abdominal pain, constipation, nausea and vomiting.  Genitourinary:  Negative for difficulty urinating, dysuria, flank pain and frequency.  Musculoskeletal:  Negative for arthralgias, back pain, joint swelling and myalgias.  Skin:  Negative for color change, pallor, rash and wound.  Neurological:  Negative for dizziness, facial asymmetry, weakness, numbness and headaches.  Psychiatric/Behavioral:  Negative for behavioral problems, confusion, self-injury and suicidal ideas.       Objective:    Physical Exam Vitals and nursing note reviewed.  Constitutional:      General: He is not in acute distress.    Appearance: Normal appearance. He is not ill-appearing, toxic-appearing or diaphoretic.  HENT:     Mouth/Throat:     Mouth: Mucous membranes are moist.     Pharynx: Oropharynx is clear.  No oropharyngeal exudate or posterior oropharyngeal erythema.  Eyes:     General: No scleral icterus.       Right eye: No discharge.        Left eye: No discharge.     Extraocular Movements: Extraocular movements intact.     Conjunctiva/sclera: Conjunctivae normal.  Cardiovascular:     Rate and Rhythm: Normal rate and regular rhythm.     Pulses: Normal pulses.     Heart sounds: Normal heart sounds. No murmur heard.    No friction rub. No gallop.  Pulmonary:     Effort: Pulmonary effort is normal. No respiratory distress.     Breath sounds: Normal breath sounds. No stridor. No wheezing, rhonchi or rales.  Chest:     Chest wall: No tenderness.  Abdominal:     General: There is no distension.  Palpations: Abdomen is soft.     Tenderness: There is no abdominal tenderness. There is no right CVA tenderness, left CVA tenderness or guarding.  Musculoskeletal:        General: No swelling, tenderness, deformity or signs of injury.     Right lower leg: No edema.     Left lower leg: No edema.  Skin:    General: Skin is warm and dry.     Capillary Refill: Capillary refill takes less than 2 seconds.     Coloration: Skin is not jaundiced or pale.     Findings: No bruising, erythema or lesion.  Neurological:     Mental Status: He is alert and oriented to person, place, and time.     Motor: No weakness.     Coordination: Coordination normal.     Gait: Gait normal.  Psychiatric:        Mood and Affect: Mood normal.        Behavior: Behavior normal.        Thought Content: Thought content normal.        Judgment: Judgment normal.     BP 116/79 (BP Location: Right Arm, Patient Position: Sitting, Cuff Size: Normal)   Pulse 74   Temp 98.5 F (36.9 C)   Resp 14   Ht 5\' 5"  (1.651 m)   Wt 141 lb (64 kg)   SpO2 99%   BMI 23.46 kg/m  Wt Readings from Last 3 Encounters:  06/23/23 141 lb (64 kg)  05/26/23 141 lb (64 kg)  05/19/23 158 lb 1.1 oz (71.7 kg)    Lab Results  Component  Value Date   TSH 1.929 05/17/2023   Lab Results  Component Value Date   WBC 7.0 05/20/2023   HGB 11.5 (L) 05/20/2023   HCT 34.7 (L) 05/20/2023   MCV 84.0 05/20/2023   PLT 219 05/20/2023   Lab Results  Component Value Date   NA 133 (L) 05/26/2023   K 4.7 05/26/2023   CO2 22 05/26/2023   GLUCOSE 446 (H) 05/26/2023   BUN 27 05/26/2023   CREATININE 1.63 (H) 05/26/2023   BILITOT 0.7 05/17/2023   ALKPHOS 84 05/17/2023   AST 15 05/17/2023   ALT 28 05/17/2023   PROT 7.0 05/17/2023   ALBUMIN 3.0 (L) 05/17/2023   CALCIUM 8.8 05/26/2023   ANIONGAP 12 05/20/2023   EGFR 47 (L) 05/26/2023   Lab Results  Component Value Date   CHOL 97 05/17/2023   Lab Results  Component Value Date   HDL 23 (L) 05/17/2023   Lab Results  Component Value Date   LDLCALC 49 05/17/2023   Lab Results  Component Value Date   TRIG 123 05/17/2023   Lab Results  Component Value Date   CHOLHDL 4.2 05/17/2023   Lab Results  Component Value Date   HGBA1C 11.1 (H) 05/17/2023      Assessment & Plan:   Problem List Items Addressed This Visit       Endocrine   Uncontrolled type 2 diabetes mellitus with hyperglycemia (HCC)    Home blood sugar readings remains elevated Will increase Semglee to 14 units daily Continue sliding scale Humalog Patient referred to the clinical pharmacist to assist with medication management Diabetic foot exam completed in the office today Up-to-date with diabetic eye exam ,will obtain records from ophthalmologist  Uptodate with  urine microalbumin labs Patient counseled on low-carb modified diets Encouraged engage in regular moderate exercise at least 150 minutes weekly Follow-up in the office  in 2 months Patient with benefit from addition of an SGLT2 for kidney protection      Relevant Medications   insulin glargine-yfgn (SEMGLEE, YFGN,) 100 UNIT/ML Pen   Other Relevant Orders   AMB Referral [facility] Care Management     Other   Elevated serum creatinine - Primary     Lab Results  Component Value Date   NA 133 (L) 05/26/2023   K 4.7 05/26/2023   CO2 22 05/26/2023   GLUCOSE 446 (H) 05/26/2023   BUN 27 05/26/2023   CREATININE 1.63 (H) 05/26/2023   CALCIUM 8.8 05/26/2023   EGFR 47 (L) 05/26/2023   GFRNONAA 45 (L) 05/20/2023  Patient will benefit from addition of an SGLT2 for kidney protection but he recently had pyelonephritis Patient referred to the clinical pharmacist to assist with medication management       Meds ordered this encounter  Medications   insulin glargine-yfgn (SEMGLEE, YFGN,) 100 UNIT/ML Pen    Sig: Inject 14 Units into the skin daily.    Dispense:  3 mL    Refill:  1    Follow-up: Return in about 2 months (around 08/23/2023).    Donell Beers, FNP

## 2023-06-23 NOTE — Assessment & Plan Note (Addendum)
Home blood sugar readings remains elevated Will increase Semglee to 14 units daily Continue sliding scale Humalog Patient referred to the clinical pharmacist to assist with medication management Diabetic foot exam completed in the office today Up-to-date with diabetic eye exam ,will obtain records from ophthalmologist  Uptodate with  urine microalbumin labs Patient counseled on low-carb modified diets Encouraged engage in regular moderate exercise at least 150 minutes weekly Follow-up in the office in 2 months Patient with benefit from addition of an SGLT2 for kidney protection

## 2023-06-23 NOTE — Patient Instructions (Signed)
1. Uncontrolled type 2 diabetes mellitus with hyperglycemia (HCC)  - insulin glargine-yfgn (SEMGLEE, YFGN,) 100 UNIT/ML Pen; Inject 14 Units into the skin daily.  Dispense: 3 mL; Refill: 1   Goal for fasting blood sugar ranges from 80 to 120 and 2 hours after any meal or at bedtime should be between 130 to 170.     It is important that you exercise regularly at least 30 minutes 5 times a week as tolerated  Think about what you will eat, plan ahead. Choose " clean, green, fresh or frozen" over canned, processed or packaged foods which are more sugary, salty and fatty. 70 to 75% of food eaten should be vegetables and fruit. Three meals at set times with snacks allowed between meals, but they must be fruit or vegetables. Aim to eat over a 12 hour period , example 7 am to 7 pm, and STOP after  your last meal of the day. Drink water,generally about 64 ounces per day, no other drink is as healthy. Fruit juice is best enjoyed in a healthy way, by EATING the fruit.  Thanks for choosing [facility] we consider it a privelige to serve you.

## 2023-06-24 DIAGNOSIS — H25811 Combined forms of age-related cataract, right eye: Secondary | ICD-10-CM | POA: Diagnosis not present

## 2023-06-24 DIAGNOSIS — H2511 Age-related nuclear cataract, right eye: Secondary | ICD-10-CM | POA: Diagnosis not present

## 2023-06-25 DIAGNOSIS — H25012 Cortical age-related cataract, left eye: Secondary | ICD-10-CM | POA: Diagnosis not present

## 2023-06-25 DIAGNOSIS — H25042 Posterior subcapsular polar age-related cataract, left eye: Secondary | ICD-10-CM | POA: Diagnosis not present

## 2023-06-25 DIAGNOSIS — H2512 Age-related nuclear cataract, left eye: Secondary | ICD-10-CM | POA: Diagnosis not present

## 2023-06-26 LAB — COLOGUARD: COLOGUARD: NEGATIVE

## 2023-07-22 ENCOUNTER — Other Ambulatory Visit: Payer: Self-pay

## 2023-07-22 ENCOUNTER — Telehealth: Payer: Self-pay | Admitting: Pharmacist

## 2023-07-22 DIAGNOSIS — H2512 Age-related nuclear cataract, left eye: Secondary | ICD-10-CM | POA: Diagnosis not present

## 2023-07-22 DIAGNOSIS — H25812 Combined forms of age-related cataract, left eye: Secondary | ICD-10-CM | POA: Diagnosis not present

## 2023-07-22 NOTE — Progress Notes (Signed)
Attempted to contact patient for scheduled appointment for medication management. Left HIPAA compliant message for patient to return my call at their convenience.   Catie T. Harper, PharmD, BCACP, CPP Clinical Pharmacist [facility] [phone]  

## 2023-07-26 ENCOUNTER — Telehealth: Payer: Self-pay

## 2023-07-26 NOTE — Progress Notes (Signed)
   Care Guide Note  07/26/2023 Name: Nicholas Johnston MRN: [id number] DOB: [birth year]  Referred by: Donell Beers, FNP Reason for referral : Care Coordination (Outreach to reschedule with Pharm d )   Nicholas Johnston is a 67 y.o. year old male who is a primary care patient of Donell Beers, FNP. Nicholas Johnston was referred to the pharmacist for assistance related to DM.    An unsuccessful telephone outreach was attempted today to contact the patient who was referred to the pharmacy team for assistance with medication management. Additional attempts will be made to contact the patient.   Penne Lash , RMA     [facility]  [facility], [facility] Guide  Direct Dial: [phone]  Website: [email or web address]

## 2023-08-12 NOTE — Progress Notes (Signed)
Complex Care Management Care Guide Note  08/12/2023 Name: [name] MRN: [id number] DOB: April 20, 1956  Nicholas Johnston is a 67 y.o. year old male who is a primary care patient of [name], Baird Kay, FNP and is actively engaged with the care management team. I reached out to [facility] by phone today to assist with re-scheduling  with the Pharmacist.  Follow up plan: Unsuccessful telephone outreach attempt made. A HIPAA compliant phone message was left for the patient providing contact information and requesting a return call.  Penne Lash , RMA     [facility]  [facility], [facility] Guide  Direct Dial: [phone]  Website: [email or web address]

## 2023-08-23 ENCOUNTER — Encounter: Payer: Self-pay | Admitting: Nurse Practitioner

## 2023-08-23 ENCOUNTER — Ambulatory Visit (INDEPENDENT_AMBULATORY_CARE_PROVIDER_SITE_OTHER): Payer: Medicaid Other | Admitting: Nurse Practitioner

## 2023-08-23 VITALS — BP 117/76 | HR 82 | Temp 97.3°F | Wt 151.0 lb

## 2023-08-23 DIAGNOSIS — E785 Hyperlipidemia, unspecified: Secondary | ICD-10-CM

## 2023-08-23 DIAGNOSIS — D649 Anemia, unspecified: Secondary | ICD-10-CM | POA: Diagnosis not present

## 2023-08-23 DIAGNOSIS — E039 Hypothyroidism, unspecified: Secondary | ICD-10-CM

## 2023-08-23 DIAGNOSIS — R7989 Other specified abnormal findings of blood chemistry: Secondary | ICD-10-CM

## 2023-08-23 DIAGNOSIS — E1165 Type 2 diabetes mellitus with hyperglycemia: Secondary | ICD-10-CM | POA: Diagnosis not present

## 2023-08-23 MED ORDER — ATORVASTATIN CALCIUM 40 MG PO TABS
40.0000 mg | ORAL_TABLET | Freq: Every day | ORAL | 1 refills | Status: DC
Start: 2023-08-23 — End: 2023-11-09

## 2023-08-23 MED ORDER — LEVOTHYROXINE SODIUM 25 MCG PO TABS
25.0000 ug | ORAL_TABLET | Freq: Every morning | ORAL | 1 refills | Status: DC
Start: 2023-08-23 — End: 2023-12-03

## 2023-08-23 MED ORDER — GLIPIZIDE 5 MG PO TABS
5.0000 mg | ORAL_TABLET | Freq: Two times a day (BID) | ORAL | 3 refills | Status: DC
Start: 2023-08-23 — End: 2023-09-20

## 2023-08-23 NOTE — Assessment & Plan Note (Signed)
 Lab Results  Component Value Date   NA 133 (L) 05/26/2023   K 4.7 05/26/2023   CO2 22 05/26/2023   GLUCOSE 446 (H) 05/26/2023   BUN 27 05/26/2023   CREATININE 1.63 (H) 05/26/2023   CALCIUM  8.8 05/26/2023   EGFR 47 (L) 05/26/2023   GFRNONAA 45 (L) 05/20/2023  Checking BMP today Avoid NSAIDs and other nephrotoxic agents

## 2023-08-23 NOTE — Progress Notes (Signed)
 Established Patient Office Visit  Subjective:  Patient ID: Nicholas Johnston, male    DOB: 1955-12-07  Age: 68 y.o. MRN: [id number]  CC:  Chief Complaint  Patient presents with   Medical Management of Chronic Issues    HPI Oluwatosin Higginson is a 68 y.o. male  has a past medical history of Acute pyelonephritis (05/17/2023), Diabetes mellitus without complication (HCC), Dyslipidemia, goal LDL below 70 (05/26/2023), Hyperlipidemia, Insomnia (05/26/2023), and Uncontrolled type 2 diabetes mellitus with hyperglycemia (HCC) (05/26/2023).  Patient present for follow-up for his chronic medical conditions, he is accompanied by his daughter.  Interpretation services provided via [facility]  Uncontrolled type 2 diabetes.  Patient stated that he ran out of insulin  about a month ago he has been taking ''1 pill of metformin  twice daily'' not sure of the dosage.  Patient stated that it is difficult for him to use insulin  pen due to his vision, he would rather prefer pills.  He denies polyphagia polyuria polydipsia, eats rice, drinks coffee daily at work. States that he is up to date with yearly eye exam.  He was referred to the clinical pharmacist for assistance with medication management but they were unable to reach him.  Patient's phone number has been updated in the chart today his daughter's phone number also updated in the chart.  Patient lives home alone with his wife.  He has 2 children, 1 is at home only during the weekends.   Patient encouraged to get shingles vaccine and pneumonia vaccines at the pharmacy.    Past Medical History:  Diagnosis Date   Acute pyelonephritis 05/17/2023   Diabetes mellitus without complication (HCC)    Dyslipidemia, goal LDL below 70 05/26/2023   Hyperlipidemia    Insomnia 05/26/2023   Uncontrolled type 2 diabetes mellitus with hyperglycemia (HCC) 05/26/2023    History reviewed. No pertinent surgical history.  Family History  Family history unknown: Yes    Social  History   Socioeconomic History   Marital status: Married    Spouse name: Sa Nay   Number of children: 3   Years of education: Not on file   Highest education level: Not on file  Occupational History   Not on file  Tobacco Use   Smoking status: Never   Smokeless tobacco: Never  Vaping Use   Vaping status: Never Used  Substance and Sexual Activity   Alcohol use: Not Currently    Comment: occ   Drug use: No   Sexual activity: Not Currently  Other Topics Concern   Not on file  Social History Narrative   Lives with his family    Social Drivers of Corporate Investment Banker Strain: Not on file  Food Insecurity: No Food Insecurity (05/24/2023)   Hunger Vital Sign    Worried About Running Out of Food in the Last Year: Never true    Ran Out of Food in the Last Year: Never true  Transportation Needs: No Transportation Needs (05/24/2023)   PRAPARE - Administrator, Civil Service (Medical): No    Lack of Transportation (Non-Medical): No  Physical Activity: Not on file  Stress: Not on file  Social Connections: Not on file  Intimate Partner Violence: Not At Risk (05/17/2023)   Humiliation, Afraid, Rape, and Kick questionnaire    Fear of Current or Ex-Partner: No    Emotionally Abused: No    Physically Abused: No    Sexually Abused: No    Outpatient Medications Prior to Visit  Medication Sig Dispense Refill   acetaminophen  (TYLENOL ) 500 MG tablet Take 1,000 mg by mouth 2 (two) times daily as needed for moderate pain, fever or headache.     Blood Glucose Monitoring Suppl DEVI 1 each by Does not apply route 3 (three) times daily. May dispense any manufacturer covered by patient's insurance. 1 each 0   Glucose Blood (BLOOD GLUCOSE TEST STRIPS) STRP 1 each by Does not apply route 3 (three) times daily. Use as directed to check blood sugar. May dispense any manufacturer covered by patient's insurance and fits patient's device. 100 strip 0   Insulin  Pen Needle (PEN NEEDLES)  31G X 5 MM MISC 1 each by Does not apply route 3 (three) times daily. May dispense any manufacturer covered by patient's insurance. 100 each 0   Lancet Device MISC 1 each by Does not apply route 3 (three) times daily. May dispense any manufacturer covered by patient's insurance. 1 each 0   Lancets MISC 1 each by Does not apply route 3 (three) times daily. Use as directed to check blood sugar. May dispense any manufacturer covered by patient's insurance and fits patient's device. 100 each 0   atorvastatin  (LIPITOR) 40 MG tablet Take 40 mg by mouth daily.     levothyroxine  (SYNTHROID ) 25 MCG tablet Take 25 mcg by mouth every morning.     melatonin 5 MG TABS Take 1 tablet (5 mg total) by mouth at bedtime as needed. (Patient not taking: Reported on 08/23/2023) 60 tablet 0   insulin  glargine-yfgn (SEMGLEE , YFGN,) 100 UNIT/ML Pen Inject 14 Units into the skin daily. (Patient not taking: Reported on 08/23/2023) 3 mL 1   insulin  lispro (HUMALOG ) 100 UNIT/ML KwikPen Inject 0-6 Units into the skin 3 (three) times daily with meals. Check Blood Glucose (BG) and inject per scale: BG <150= 0 unit; BG 150-200= 1 unit; BG 201-250= 2 unit; BG 251-300= 3 unit; BG 301-350= 4 unit; BG 351-400= 5 unit; BG >400= 6 unit and Call Primary Care. (Patient not taking: Reported on 08/23/2023) 3 mL 1   No facility-administered medications prior to visit.    No Known Allergies  ROS Review of Systems  Constitutional:  Negative for appetite change, chills, fatigue and fever.  HENT:  Negative for congestion, postnasal drip, rhinorrhea and sneezing.   Eyes:  Positive for visual disturbance.  Respiratory:  Negative for cough, shortness of breath and wheezing.   Cardiovascular:  Negative for chest pain, palpitations and leg swelling.  Gastrointestinal:  Negative for abdominal pain, constipation, nausea and vomiting.  Genitourinary:  Negative for difficulty urinating, dysuria, flank pain and frequency.  Musculoskeletal:  Negative for  arthralgias, back pain, joint swelling and myalgias.  Skin:  Negative for color change, pallor, rash and wound.  Neurological:  Negative for dizziness, facial asymmetry, weakness, numbness and headaches.  Psychiatric/Behavioral:  Negative for behavioral problems, confusion, self-injury and suicidal ideas.       Objective:    Physical Exam Vitals and nursing note reviewed.  Constitutional:      General: He is not in acute distress.    Appearance: Normal appearance. He is not ill-appearing, toxic-appearing or diaphoretic.  HENT:     Mouth/Throat:     Mouth: Mucous membranes are moist.     Pharynx: Oropharynx is clear. No oropharyngeal exudate or posterior oropharyngeal erythema.  Eyes:     General: No scleral icterus.       Right eye: No discharge.        Left eye: No  discharge.     Extraocular Movements: Extraocular movements intact.     Conjunctiva/sclera: Conjunctivae normal.  Cardiovascular:     Rate and Rhythm: Normal rate and regular rhythm.     Pulses: Normal pulses.     Heart sounds: Normal heart sounds. No murmur heard.    No friction rub. No gallop.  Pulmonary:     Effort: Pulmonary effort is normal. No respiratory distress.     Breath sounds: Normal breath sounds. No stridor. No wheezing, rhonchi or rales.  Chest:     Chest wall: No tenderness.  Abdominal:     General: There is no distension.     Palpations: Abdomen is soft.     Tenderness: There is no abdominal tenderness. There is no right CVA tenderness, left CVA tenderness or guarding.  Musculoskeletal:        General: No swelling, tenderness, deformity or signs of injury.     Right lower leg: No edema.     Left lower leg: No edema.  Skin:    General: Skin is warm and dry.     Capillary Refill: Capillary refill takes less than 2 seconds.     Coloration: Skin is not jaundiced or pale.     Findings: No bruising, erythema or lesion.  Neurological:     Mental Status: He is alert and oriented to person, place,  and time.     Motor: No weakness.     Coordination: Coordination normal.     Gait: Gait normal.  Psychiatric:        Mood and Affect: Mood normal.        Behavior: Behavior normal.        Thought Content: Thought content normal.        Judgment: Judgment normal.     BP 117/76   Pulse 82   Temp (!) 97.3 F (36.3 C)   Wt 151 lb (68.5 kg)   SpO2 98%   BMI 25.13 kg/m  Wt Readings from Last 3 Encounters:  08/23/23 151 lb (68.5 kg)  06/23/23 141 lb (64 kg)  05/26/23 141 lb (64 kg)    Lab Results  Component Value Date   TSH 1.929 05/17/2023   Lab Results  Component Value Date   WBC 7.0 05/20/2023   HGB 11.5 (L) 05/20/2023   HCT 34.7 (L) 05/20/2023   MCV 84.0 05/20/2023   PLT 219 05/20/2023   Lab Results  Component Value Date   NA 133 (L) 05/26/2023   K 4.7 05/26/2023   CO2 22 05/26/2023   GLUCOSE 446 (H) 05/26/2023   BUN 27 05/26/2023   CREATININE 1.63 (H) 05/26/2023   BILITOT 0.7 05/17/2023   ALKPHOS 84 05/17/2023   AST 15 05/17/2023   ALT 28 05/17/2023   PROT 7.0 05/17/2023   ALBUMIN 3.0 (L) 05/17/2023   CALCIUM  8.8 05/26/2023   ANIONGAP 12 05/20/2023   EGFR 47 (L) 05/26/2023   Lab Results  Component Value Date   CHOL 97 05/17/2023   Lab Results  Component Value Date   HDL 23 (L) 05/17/2023   Lab Results  Component Value Date   LDLCALC 49 05/17/2023   Lab Results  Component Value Date   TRIG 123 05/17/2023   Lab Results  Component Value Date   CHOLHDL 4.2 05/17/2023   Lab Results  Component Value Date   HGBA1C 11.1 (H) 05/17/2023      Assessment & Plan:   Problem List Items Addressed This Visit  Endocrine   Uncontrolled type 2 diabetes mellitus with hyperglycemia (HCC)   Lab Results  Component Value Date   HGBA1C 11.1 (H) 05/17/2023  Chronic uncontrolled condition Humalog  and Semglee  discontinued due to patient not being able to self inject medication correctly due to his impaired  vision Start glipizide  5 mg twice  daily Continue current dose of metformin , patient's daughter will call the office to let us  know how much metformin  the patient is taking Patient counseled on low-carb diet CBG goals discussed Follow-up in the office in 4 weeks For number to the clinical pharmacist team provided, encouraged to call them to schedule an appointment      Relevant Medications   glipiZIDE  (GLUCOTROL ) 5 MG tablet   atorvastatin  (LIPITOR) 40 MG tablet   Other Relevant Orders   POCT glycosylated hemoglobin (Hb A1C)   Ambulatory referral to diabetic education   AMB Referral VBCI Care Management   Hypothyroidism   Continue levothyroxine  25 mcg daily Med refilled      Relevant Medications   levothyroxine  (SYNTHROID ) 25 MCG tablet     Other   Dyslipidemia, goal LDL below 70 - Primary   Relevant Medications   atorvastatin  (LIPITOR) 40 MG tablet   Other Relevant Orders   Direct LDL   Elevated serum creatinine   Lab Results  Component Value Date   NA 133 (L) 05/26/2023   K 4.7 05/26/2023   CO2 22 05/26/2023   GLUCOSE 446 (H) 05/26/2023   BUN 27 05/26/2023   CREATININE 1.63 (H) 05/26/2023   CALCIUM  8.8 05/26/2023   EGFR 47 (L) 05/26/2023   GFRNONAA 45 (L) 05/20/2023  Checking BMP today Avoid NSAIDs and other nephrotoxic agents      Relevant Orders   Basic Metabolic Panel   Other Visit Diagnoses       Anemia, unspecified type       Relevant Orders   CBC       Meds ordered this encounter  Medications   glipiZIDE  (GLUCOTROL ) 5 MG tablet    Sig: Take 1 tablet (5 mg total) by mouth 2 (two) times daily before a meal.    Dispense:  60 tablet    Refill:  3   levothyroxine  (SYNTHROID ) 25 MCG tablet    Sig: Take 1 tablet (25 mcg total) by mouth every morning.    Dispense:  90 tablet    Refill:  1   atorvastatin  (LIPITOR) 40 MG tablet    Sig: Take 1 tablet (40 mg total) by mouth daily.    Dispense:  90 tablet    Refill:  1    Follow-up: Return in about 4 weeks (around 09/20/2023) for DM.     Bannon Giammarco R Witten Certain, FNP

## 2023-08-23 NOTE — Assessment & Plan Note (Addendum)
 Continue levothyroxine 25 mcg daily Med refilled

## 2023-08-23 NOTE — Assessment & Plan Note (Signed)
 Lab Results  Component Value Date   HGBA1C 11.1 (H) 05/17/2023  Chronic uncontrolled condition Humalog  and Semglee  discontinued due to patient not being able to self inject medication correctly due to his impaired  vision Start glipizide  5 mg twice daily Continue current dose of metformin , patient's daughter will call the office to let us  know how much metformin  the patient is taking Patient counseled on low-carb diet CBG goals discussed Follow-up in the office in 4 weeks For number to the clinical pharmacist team provided, encouraged to call them to schedule an appointment

## 2023-08-23 NOTE — Patient Instructions (Addendum)
 Please consider getting Shingrix and pneumonia vaccine at local pharmacy.     Goal for fasting blood sugar ranges from 80 to 120 and 2 hours after any meal or at bedtime should be between 130 to 170.    2. Uncontrolled type 2 diabetes mellitus with hyperglycemia (HCC)  - POCT glycosylated hemoglobin (Hb A1C) - glipiZIDE  (GLUCOTROL ) 5 MG tablet; Take 1 tablet (5 mg total) by mouth 2 (two) times daily before a meal.  Dispense: 60 tablet; Refill: 3 - Ambulatory referral to diabetic education     It is important that you exercise regularly at least 30 minutes 5 times a week as tolerated  Think about what you will eat, plan ahead. Choose  clean, green, fresh or frozen over canned, processed or packaged foods which are more sugary, salty and fatty. 70 to 75% of food eaten should be vegetables and fruit. Three meals at set times with snacks allowed between meals, but they must be fruit or vegetables. Aim to eat over a 12 hour period , example 7 am to 7 pm, and STOP after  your last meal of the day. Drink water,generally about 64 ounces per day, no other drink is as healthy. Fruit juice is best enjoyed in a healthy way, by EATING the fruit.  Thanks for choosing [facility] we consider it a privelige to serve you.

## 2023-08-24 LAB — BASIC METABOLIC PANEL
BUN/Creatinine Ratio: 18 (ref 10–24)
BUN: 21 mg/dL (ref 8–27)
CO2: 24 mmol/L (ref 20–29)
Calcium: 9 mg/dL (ref 8.6–10.2)
Chloride: 98 mmol/L (ref 96–106)
Creatinine, Ser: 1.15 mg/dL (ref 0.76–1.27)
Glucose: 284 mg/dL — ABNORMAL HIGH (ref 70–99)
Potassium: 4.3 mmol/L (ref 3.5–5.2)
Sodium: 136 mmol/L (ref 134–144)
eGFR: 71 mL/min/{1.73_m2} (ref >59)

## 2023-08-24 LAB — CBC
Hematocrit: 41.8 % (ref 37.5–51.0)
Hemoglobin: 13.5 g/dL (ref 13.0–17.7)
MCH: 28 pg (ref 26.6–33.0)
MCHC: 32.3 g/dL (ref 31.5–35.7)
MCV: 87 fL (ref 79–97)
Platelets: 203 10*3/uL (ref 150–450)
RBC: 4.83 x10E6/uL (ref 4.14–5.80)
RDW: 12.5 % (ref 11.6–15.4)
WBC: 6.2 10*3/uL (ref 3.4–10.8)

## 2023-08-24 LAB — POCT GLYCOSYLATED HEMOGLOBIN (HGB A1C): Hemoglobin A1C: 11.5 % — AB (ref 4.0–5.6)

## 2023-08-24 LAB — LDL CHOLESTEROL, DIRECT: LDL Direct: 91 mg/dL (ref 0–99)

## 2023-08-25 NOTE — Progress Notes (Signed)
 Complex Care Management Care Guide Note  08/25/2023 Name: Nicholas Johnston MRN: [id number] DOB: June 03, 1956  Nicholas Johnston is a 68 y.o. year old male who is a primary care patient of [name], [name], FNP and is actively engaged with the care management team. I reached out to [facility] by phone today to assist with re-scheduling  with the Pharmacist.  Follow up plan: Third Unsuccessful telephone outreach attempt made. A HIPAA compliant phone message was left for the patient providing contact information and requesting a return call.  Jeoffrey Buffalo , RMA     [facility]  [facility], [facility] Guide  Direct Dial : [phone]  Website: [email or web address]

## 2023-08-27 NOTE — Progress Notes (Signed)
 Care Guide Pharmacy Note  08/27/2023 Name: [name] MRN: [id number] DOB: 1956-03-01  Referred By: [name], [name], FNP Reason for referral: Care Coordination (Outreach to reschedule with Pharm d )   Nicholas Johnston is a 68 y.o. year old male who is a primary care patient of [name], [name], FNP.  Birdie Minchew was referred to the pharmacist for assistance related to: DMII  An unsuccessful telephone outreach was attempted today to contact the patient who was referred to the pharmacy team for assistance with medication management. Additional attempts will be made to contact the patient.  Jeoffrey Buffalo , RMA     [facility]  [facility], [facility] Guide  Direct Dial : [phone]  Website: [email or web address]

## 2023-08-30 LAB — HM DIABETES EYE EXAM

## 2023-08-31 ENCOUNTER — Telehealth: Payer: Self-pay

## 2023-09-07 NOTE — Telephone Encounter (Signed)
 Marland Kitchen

## 2023-09-20 ENCOUNTER — Encounter: Payer: Self-pay | Admitting: Nurse Practitioner

## 2023-09-20 ENCOUNTER — Ambulatory Visit (INDEPENDENT_AMBULATORY_CARE_PROVIDER_SITE_OTHER): Payer: Medicaid Other | Admitting: Nurse Practitioner

## 2023-09-20 VITALS — BP 95/52 | HR 77 | Temp 97.2°F | Wt 152.0 lb

## 2023-09-20 DIAGNOSIS — E785 Hyperlipidemia, unspecified: Secondary | ICD-10-CM

## 2023-09-20 DIAGNOSIS — E1165 Type 2 diabetes mellitus with hyperglycemia: Secondary | ICD-10-CM

## 2023-09-20 MED ORDER — METFORMIN HCL ER 500 MG PO TB24: 500.0000 mg | ORAL_TABLET | Freq: Every day | ORAL | 3 refills | Status: DC

## 2023-09-20 MED ORDER — METFORMIN HCL ER 500 MG PO TB24: 500.0000 mg | ORAL_TABLET | Freq: Two times a day (BID) | ORAL | 3 refills | Status: DC

## 2023-09-20 MED ORDER — GLIPIZIDE 5 MG PO TABS: 5.0000 mg | ORAL_TABLET | Freq: Two times a day (BID) | ORAL | 3 refills | Status: DC

## 2023-09-20 NOTE — Assessment & Plan Note (Addendum)
Lab Results  Component Value Date   CHOL 97 05/17/2023   HDL 23 (L) 05/17/2023   LDLCALC 49 05/17/2023   LDLDIRECT 91 08/23/2023   TRIG 123 05/17/2023   CHOLHDL 4.2 05/17/2023  Direct LDL was 91 at last visit Continue atorvastatin 40 mg daily Avoid fatty fried foods We will recheck labs at next visit Avoid fatty fried foods

## 2023-09-20 NOTE — Patient Instructions (Addendum)
Goal for fasting blood sugar ranges from 80 to 120 and 2 hours after any meal or at bedtime should be between 130 to 170.     Please call the number below to schedule an appointment with pharmacist.   Penne Lash , RMA     [facility], [facility] Guide  Direct Dial: [phone]   1. Uncontrolled type 2 diabetes mellitus with hyperglycemia (HCC) (Primary)  - metFORMIN (GLUCOPHAGE-XR) 500 MG 24 hr tablet; Take 1 tablet (500 mg total) by mouth daily with breakfast.  Dispense: 60 tablet; Refill: 3 - glipiZIDE (GLUCOTROL) 5 MG tablet; Take 1 tablet (5 mg total) by mouth 2 (two) times daily before a meal.  Dispense: 60 tablet; Refill: 3    It is important that you exercise regularly at least 30 minutes 5 times a week as tolerated  Think about what you will eat, plan ahead. Choose " clean, green, fresh or frozen" over canned, processed or packaged foods which are more sugary, salty and fatty. 70 to 75% of food eaten should be vegetables and fruit. Three meals at set times with snacks allowed between meals, but they must be fruit or vegetables. Aim to eat over a 12 hour period , example 7 am to 7 pm, and STOP after  your last meal of the day. Drink water,generally about 64 ounces per day, no other drink is as healthy. Fruit juice is best enjoyed in a healthy way, by EATING the fruit.  Thanks for choosing [facility] we consider it a privelige to serve you.

## 2023-09-20 NOTE — Assessment & Plan Note (Addendum)
Lab Results  Component Value Date   HGBA1C 11.5 (A) 08/23/2023  Patient encouraged to start taking glipizide 5 mg twice daily Restart metformin 500 mg twice daily Patient counseled on low-carb diet CBG goals discussed Need to follow-up with the clinical pharmacist for assistance with management of type 2 diabetes discussed. I have sent a message to [place] to assist with disease management. Follow-up in the office in 4 weeks

## 2023-09-20 NOTE — Progress Notes (Signed)
Established Patient Office Visit  Subjective:  Patient ID: Nicholas Johnston, male    DOB: [birth year]  Age: 68 y.o. MRN: [id number]  CC:  Chief Complaint  Patient presents with   Diabetes    4 week follow up    HPI Nicholas Johnston is a 68 y.o. male  has a past medical history of Acute pyelonephritis (05/17/2023), Diabetes mellitus without complication (HCC), Dyslipidemia, goal LDL below 70 (05/26/2023), Hyperlipidemia, Insomnia (05/26/2023), and Uncontrolled type 2 diabetes mellitus with hyperglycemia (HCC) (05/26/2023).  Patient present for follow-up for his chronic medical conditions  Type 2 diabetes.  Currently on glipizide 5 mg daily.  He is supposed to be taking 5 mg twice daily but has been taking only once daily.  He has also stopped taking metformin.  Stated that he thought he was supposed to stop taking metformin.  He stated that he has been feeling much better since starting glipizide.  Patient denies hypoglycemia.  Random blood sugars readings has been between 140s to 180s.  He was referred to the clinical pharmacist at his last visit but they were  unable to reach him. None of the patient's children is around during the week days but he has a son that is around with him on weekends.  I encouraged them to to reach out to the clinical pharmacist and schedule an appointment.    Patient is accompanied by his daughter Interpretation services provided via [place] iPad    Past Medical History:  Diagnosis Date   Acute pyelonephritis 05/17/2023   Diabetes mellitus without complication (HCC)    Dyslipidemia, goal LDL below 70 05/26/2023   Hyperlipidemia    Insomnia 05/26/2023   Uncontrolled type 2 diabetes mellitus with hyperglycemia (HCC) 05/26/2023    History reviewed. No pertinent surgical history.  Family History  Family history unknown: Yes    Social History   Socioeconomic History   Marital status: Married    Spouse name: Sa Nay   Number of children: 3   Years of  education: Not on file   Highest education level: Not on file  Occupational History   Not on file  Tobacco Use   Smoking status: Never   Smokeless tobacco: Never  Vaping Use   Vaping status: Never Used  Substance and Sexual Activity   Alcohol use: Not Currently    Comment: occ   Drug use: No   Sexual activity: Not Currently  Other Topics Concern   Not on file  Social History Narrative   Lives with his family    Social Drivers of Corporate investment banker Strain: Not on file  Food Insecurity: No Food Insecurity (05/24/2023)   Hunger Vital Sign    Worried About Running Out of Food in the Last Year: Never true    Ran Out of Food in the Last Year: Never true  Transportation Needs: No Transportation Needs (05/24/2023)   PRAPARE - Administrator, Civil Service (Medical): No    Lack of Transportation (Non-Medical): No  Physical Activity: Not on file  Stress: Not on file  Social Connections: Not on file  Intimate Partner Violence: Not At Risk (05/17/2023)   Humiliation, Afraid, Rape, and Kick questionnaire    Fear of Current or Ex-Partner: No    Emotionally Abused: No    Physically Abused: No    Sexually Abused: No    Outpatient Medications Prior to Visit  Medication Sig Dispense Refill   acetaminophen (TYLENOL) 500 MG tablet Take 1,000  mg by mouth 2 (two) times daily as needed for moderate pain, fever or headache.     atorvastatin (LIPITOR) 40 MG tablet Take 1 tablet (40 mg total) by mouth daily. 90 tablet 1   Blood Glucose Monitoring Suppl DEVI 1 each by Does not apply route 3 (three) times daily. May dispense any manufacturer covered by patient's insurance. 1 each 0   Glucose Blood (BLOOD GLUCOSE TEST STRIPS) STRP 1 each by Does not apply route 3 (three) times daily. Use as directed to check blood sugar. May dispense any manufacturer covered by patient's insurance and fits patient's device. 100 strip 0   Lancet Device MISC 1 each by Does not apply route 3 (three)  times daily. May dispense any manufacturer covered by patient's insurance. 1 each 0   Lancets MISC 1 each by Does not apply route 3 (three) times daily. Use as directed to check blood sugar. May dispense any manufacturer covered by patient's insurance and fits patient's device. 100 each 0   levothyroxine (SYNTHROID) 25 MCG tablet Take 1 tablet (25 mcg total) by mouth every morning. 90 tablet 1   glipiZIDE (GLUCOTROL) 5 MG tablet Take 1 tablet (5 mg total) by mouth 2 (two) times daily before a meal. 60 tablet 3   Insulin Pen Needle (PEN NEEDLES) 31G X 5 MM MISC 1 each by Does not apply route 3 (three) times daily. May dispense any manufacturer covered by patient's insurance. (Patient not taking: Reported on 09/20/2023) 100 each 0   melatonin 5 MG TABS Take 1 tablet (5 mg total) by mouth at bedtime as needed. (Patient not taking: Reported on 06/23/2023) 60 tablet 0   No facility-administered medications prior to visit.    No Known Allergies  ROS Review of Systems  Constitutional:  Negative for appetite change, chills, fatigue and fever.  HENT:  Negative for congestion, postnasal drip, rhinorrhea and sneezing.   Respiratory:  Negative for cough, shortness of breath and wheezing.   Cardiovascular:  Negative for chest pain, palpitations and leg swelling.  Gastrointestinal:  Negative for abdominal pain, constipation, nausea and vomiting.  Genitourinary:  Negative for difficulty urinating, dysuria, flank pain and frequency.  Musculoskeletal:  Negative for arthralgias, back pain, joint swelling and myalgias.  Skin:  Negative for color change, pallor, rash and wound.  Neurological:  Negative for dizziness, facial asymmetry, weakness, numbness and headaches.  Psychiatric/Behavioral:  Negative for behavioral problems, confusion, self-injury and suicidal ideas.       Objective:    Physical Exam Vitals and nursing note reviewed.  Constitutional:      General: He is not in acute distress.     Appearance: Normal appearance. He is not ill-appearing, toxic-appearing or diaphoretic.  HENT:     Mouth/Throat:     Mouth: Mucous membranes are moist.     Pharynx: Oropharynx is clear. No oropharyngeal exudate or posterior oropharyngeal erythema.  Eyes:     General: No scleral icterus.       Right eye: No discharge.        Left eye: No discharge.     Extraocular Movements: Extraocular movements intact.     Conjunctiva/sclera: Conjunctivae normal.  Cardiovascular:     Rate and Rhythm: Normal rate and regular rhythm.     Pulses: Normal pulses.     Heart sounds: Normal heart sounds. No murmur heard.    No friction rub. No gallop.  Pulmonary:     Effort: Pulmonary effort is normal. No respiratory distress.  Breath sounds: Normal breath sounds. No stridor. No wheezing, rhonchi or rales.  Chest:     Chest wall: No tenderness.  Abdominal:     General: There is no distension.     Palpations: Abdomen is soft.     Tenderness: There is no abdominal tenderness. There is no right CVA tenderness, left CVA tenderness or guarding.  Musculoskeletal:        General: No swelling, tenderness, deformity or signs of injury.     Right lower leg: No edema.     Left lower leg: No edema.  Skin:    General: Skin is warm and dry.     Capillary Refill: Capillary refill takes less than 2 seconds.     Coloration: Skin is not jaundiced or pale.     Findings: No bruising, erythema or lesion.  Neurological:     Mental Status: He is alert and oriented to person, place, and time.     Motor: No weakness.     Coordination: Coordination normal.     Gait: Gait normal.  Psychiatric:        Mood and Affect: Mood normal.        Behavior: Behavior normal.        Thought Content: Thought content normal.        Judgment: Judgment normal.     BP (!) 95/52   Pulse 77   Temp (!) 97.2 F (36.2 C)   Wt 152 lb (68.9 kg)   SpO2 98%   BMI 25.29 kg/m  Wt Readings from Last 3 Encounters:  09/20/23 152 lb (68.9  kg)  08/23/23 151 lb (68.5 kg)  06/23/23 141 lb (64 kg)    Lab Results  Component Value Date   TSH 1.929 05/17/2023   Lab Results  Component Value Date   WBC 6.2 08/23/2023   HGB 13.5 08/23/2023   HCT 41.8 08/23/2023   MCV 87 08/23/2023   PLT 203 08/23/2023   Lab Results  Component Value Date   NA 136 08/23/2023   K 4.3 08/23/2023   CO2 24 08/23/2023   GLUCOSE 284 (H) 08/23/2023   BUN 21 08/23/2023   CREATININE 1.15 08/23/2023   BILITOT 0.7 05/17/2023   ALKPHOS 84 05/17/2023   AST 15 05/17/2023   ALT 28 05/17/2023   PROT 7.0 05/17/2023   ALBUMIN 3.0 (L) 05/17/2023   CALCIUM 9.0 08/23/2023   ANIONGAP 12 05/20/2023   EGFR 71 08/23/2023   Lab Results  Component Value Date   CHOL 97 05/17/2023   Lab Results  Component Value Date   HDL 23 (L) 05/17/2023   Lab Results  Component Value Date   LDLCALC 49 05/17/2023   Lab Results  Component Value Date   TRIG 123 05/17/2023   Lab Results  Component Value Date   CHOLHDL 4.2 05/17/2023   Lab Results  Component Value Date   HGBA1C 11.5 (A) 08/23/2023      Assessment & Plan:   Problem List Items Addressed This Visit       Endocrine   Uncontrolled type 2 diabetes mellitus with hyperglycemia (HCC) - Primary   Lab Results  Component Value Date   HGBA1C 11.5 (A) 08/23/2023  Patient encouraged to start taking glipizide 5 mg twice daily Restart metformin 500 mg twice daily Patient counseled on low-carb diet CBG goals discussed Need to follow-up with the clinical pharmacist for assistance with management of type 2 diabetes discussed. I have sent a message to Layna to assist  with disease management. Follow-up in the office in 4 weeks       Relevant Medications   glipiZIDE (GLUCOTROL) 5 MG tablet   metFORMIN (GLUCOPHAGE-XR) 500 MG 24 hr tablet     Other   Dyslipidemia, goal LDL below 70   Lab Results  Component Value Date   CHOL 97 05/17/2023   HDL 23 (L) 05/17/2023   LDLCALC 49 05/17/2023    LDLDIRECT 91 08/23/2023   TRIG 123 05/17/2023   CHOLHDL 4.2 05/17/2023  Direct LDL was 91 at last visit Continue atorvastatin 40 mg daily Avoid fatty fried foods We will recheck labs at next visit Avoid fatty fried foods       Meds ordered this encounter  Medications   DISCONTD: metFORMIN (GLUCOPHAGE-XR) 500 MG 24 hr tablet    Sig: Take 1 tablet (500 mg total) by mouth daily with breakfast.    Dispense:  60 tablet    Refill:  3   glipiZIDE (GLUCOTROL) 5 MG tablet    Sig: Take 1 tablet (5 mg total) by mouth 2 (two) times daily before a meal.    Dispense:  60 tablet    Refill:  3   metFORMIN (GLUCOPHAGE-XR) 500 MG 24 hr tablet    Sig: Take 1 tablet (500 mg total) by mouth 2 (two) times daily with a meal.    Dispense:  60 tablet    Refill:  3    Follow-up: Return in about 4 weeks (around 10/18/2023) for DM.    Donell Beers, FNP

## 2023-10-05 ENCOUNTER — Telehealth: Payer: Self-pay

## 2023-10-05 NOTE — Progress Notes (Signed)
Attempted to contact patient and his children to confirm availability for pharmacist telephone appt on Sunday 10/17/23. Left VM with instructions to call back and confirm appt. Will continue to follow.   Nils Pyle, PharmD PGY1 Pharmacy Resident

## 2023-10-17 ENCOUNTER — Other Ambulatory Visit: Payer: Self-pay

## 2023-10-17 ENCOUNTER — Other Ambulatory Visit (HOSPITAL_COMMUNITY): Payer: Self-pay

## 2023-10-17 DIAGNOSIS — E1165 Type 2 diabetes mellitus with hyperglycemia: Secondary | ICD-10-CM

## 2023-10-17 NOTE — Progress Notes (Unsigned)
 10/17/2023 Name: Nicholas Johnston MRN: [id number] DOB: 09/10/1955  Chief Complaint  Patient presents with   Diabetes   Hyperlipidemia    Nicholas Johnston is a 68 y.o. year old male who presented for a telephone visit.   They were referred to the pharmacist by their PCP for assistance in managing diabetes.    Subjective: Visit conducted with assistance from [facility] interpreter [phone]. Patient reports doing well today. He was not at home during our phone call and did not have access to his medications. Patient not able to confirm how he has been taking his medications. First stated he takes 4 pills daily then stated he takes 4 pills of glipizide daily. He does not live his son, but his son helps with medications and patient requested that I call him to confirm.   Care Team: Primary Care Provider: Donell Beers, FNP ; Next Scheduled Visit: 10/19/23  Medication Access/Adherence  Current Pharmacy:  [id number] G[place] Otto, [place] - [address] AT [facility] [address] & [facility] [address] [place] [address] Phone: [phone] Fax: [phone]  [place] - [facility] [address] [place] [address] Phone: [phone] Fax: [phone]   Patient reports affordability concerns with their medications: No  Patient reports access/transportation concerns to their pharmacy: No  Patient reports adherence concerns with their medications:  Yes    Diabetes:  Current medications:  glipizide IR 5 mg twice daily (patient taking 2 tablets twice daily) metformin XR 500 mg twice daily (patient taking 1 tablet daily) Medications tried in the past: Semglee, Novolog (stopped d/t pt inability to self inject insulin correctly due to impaired vision, patient also noted he was unable to afford $35 copay)  Outreached patient's son to discuss medications. Son also stated he has been taking glipizde 5 mg - 2 tablets twice daily, even though it was prescribed  for 1 tablet twice daily. Also stated he only takes metformin once daily. Generally says its difficult for him to monitor how his dad takes medications because they do not live together.   Patient reports using a glucometer and testing at inconsistent times during the day, but usually 1-2x daily. FBG: 50-70 Afternoon: 130-150, max 180  Patient reports hypoglycemic s/sx including dizziness, shakiness, sweating. Hypoglycemia occurring once every couple days. Patient usually eats a meal and BG improves. Patient denies hyperglycemic symptoms including polyuria, polydipsia, polyphagia, nocturia, neuropathy, blurred vision.  Current meal patterns: 11 AM and 1 PM meals - 11:00 AM: bread, green tea, "low sugar beverage" - 1:00 PM: hamburger, chicken wings, rice and curry  - Drinks: sometimes soda, but never drinks a whole bottle, 5 bottles of water per day  Current physical activity: no exercise, tired after work   Hyperlipidemia/ASCVD Risk Reduction  Current lipid lowering medications: atorvastatin 40 mg daily  ASCVD History: None Risk Factors: T2DM, HLD  Objective:  Lab Results  Component Value Date   HGBA1C 11.5 (A) 08/23/2023    Lab Results  Component Value Date   CREATININE 1.15 08/23/2023   BUN 21 08/23/2023   NA 136 08/23/2023   K 4.3 08/23/2023   CL 98 08/23/2023   CO2 24 08/23/2023    Lab Results  Component Value Date   CHOL 97 05/17/2023   HDL 23 (L) 05/17/2023   LDLCALC 49 05/17/2023   LDLDIRECT 91 08/23/2023   TRIG 123 05/17/2023   CHOLHDL 4.2 05/17/2023    Medications Reviewed Today  Reviewed by Vela Prose, RPH (Pharmacist) on 10/17/23 at 1329  Med List Status: <None>   Medication Order Taking? Sig Documenting Provider Last Dose Status Informant  acetaminophen (TYLENOL) 500 MG tablet [id number]  Take 1,000 mg by mouth 2 (two) times daily as needed for moderate pain, fever or headache. [provider]  Active Self, Family Member            Med Note Sherilyn Cooter, [facility]   Mon Sep 20, 2023  3:58 PM) Prn   atorvastatin (LIPITOR) 40 MG tablet [id number] Yes Take 1 tablet (40 mg total) by mouth daily. Donell Beers, FNP Taking Active   Blood Glucose Monitoring Suppl DEVI [id number]  1 each by Does not apply route 3 (three) times daily. May dispense any manufacturer covered by patient's insurance. Glade Lloyd, MD  Active   glipiZIDE (GLUCOTROL) 5 MG tablet [id number] Yes Take 1 tablet (5 mg total) by mouth 2 (two) times daily before a meal. [name], Baird Kay, FNP Taking Active            Med Note Vela Prose   Sun Oct 17, 2023  1:29 PM) Taking differently - 2 tablets BID  Glucose Blood (BLOOD GLUCOSE TEST STRIPS) STRP [id number]  1 each by Does not apply route 3 (three) times daily. Use as directed to check blood sugar. May dispense any manufacturer covered by patient's insurance and fits patient's device. Glade Lloyd, MD  Active   Insulin Pen Needle (PEN NEEDLES) 31G X 5 MM MISC [id number]  1 each by Does not apply route 3 (three) times daily. May dispense any manufacturer covered by patient's insurance.  Patient not taking: Reported on 09/20/2023   Glade Lloyd, MD  Active   Lancet Device MISC [id number]  1 each by Does not apply route 3 (three) times daily. May dispense any manufacturer covered by patient's insurance. Glade Lloyd, MD  Active   Lancets MISC [id number]  1 each by Does not apply route 3 (three) times daily. Use as directed to check blood sugar. May dispense any manufacturer covered by patient's insurance and fits patient's device. Glade Lloyd, MD  Active   levothyroxine (SYNTHROID) 25 MCG tablet [id number] Yes Take 1 tablet (25 mcg total) by mouth every morning. Donell Beers, FNP Taking Active   melatonin 5 MG TABS [id number]  Take 1 tablet (5 mg total) by mouth at bedtime as needed.  Patient not taking: Reported on 06/23/2023   Donell Beers, FNP  Active   metFORMIN (GLUCOPHAGE-XR) 500 MG 24 hr  tablet [id number] Yes Take 1 tablet (500 mg total) by mouth 2 (two) times daily with a meal. [name], Baird Kay, FNP Taking Active            Med Note Vela Prose   Sun Oct 17, 2023  1:29 PM) Taking differently - 1 tablet daily              Assessment/Plan:   Diabetes: - Currently uncontrolled based on last A1c 11.5% on 08/23/23. However, patient reported random BG readings are lower. Patient now having hypoglycemia likely due to taking more glipizide than prescribed. Counseled patient's son on obtaining a pill box so that he is able to fill this weekly for the patient and he was amenable to this. Will also switch IR glipizide to once daily XL formulation and metformin XR to once daily in the morning to avoid confusion with twice daily medications. Utilized teach back method with patient's son to ensure  understanding of medication regimen.  - Reviewed long term cardiovascular and renal outcomes of uncontrolled blood sugar - Reviewed goal A1c, goal fasting, and goal 2 hour post prandial glucose - Recommend to stop glipizide IR and start glipizide XL 5 mg daily - Recommend to take metformin XR 500 mg - 2 tablets daily in the morning - Will collaborate with PCP to implement medication changes - Recommend to check FBG and 2 hr PP BG consistently daily, reviewed treatment of hypoglycemia - Confirmed both son and patient would be together and available for follow up telephone visit on 11/07/23    Hyperlipidemia/ASCVD Risk Reduction: - Currently uncontrolled with LDL 91 mg/dL, above goal <16 mg/dL given DM. Will discuss further at follow up to avoid confusion with multiple medication changes. - Reviewed long term complications of uncontrolled cholesterol - Recommend to continue atorvastatin 40 mg daily.     Follow Up Plan: PCP on 10/19/2023 and PharmD on 11/07/2023  Jarrett Ables, PharmD PGY-1 Pharmacy Resident

## 2023-10-18 MED ORDER — GLIPIZIDE ER 5 MG PO TB24
5.0000 mg | ORAL_TABLET | Freq: Every day | ORAL | 2 refills | Status: DC
Start: 2023-10-18 — End: 2023-12-03

## 2023-10-19 ENCOUNTER — Encounter: Payer: Self-pay | Admitting: Nurse Practitioner

## 2023-10-19 ENCOUNTER — Telehealth: Payer: Self-pay

## 2023-10-19 ENCOUNTER — Ambulatory Visit (INDEPENDENT_AMBULATORY_CARE_PROVIDER_SITE_OTHER): Payer: Self-pay | Admitting: Nurse Practitioner

## 2023-10-19 VITALS — BP 126/85 | HR 91 | Temp 98.8°F | Wt 153.6 lb

## 2023-10-19 DIAGNOSIS — J309 Allergic rhinitis, unspecified: Secondary | ICD-10-CM | POA: Diagnosis not present

## 2023-10-19 DIAGNOSIS — Z23 Encounter for immunization: Secondary | ICD-10-CM | POA: Insufficient documentation

## 2023-10-19 DIAGNOSIS — E785 Hyperlipidemia, unspecified: Secondary | ICD-10-CM

## 2023-10-19 DIAGNOSIS — E1165 Type 2 diabetes mellitus with hyperglycemia: Secondary | ICD-10-CM | POA: Diagnosis not present

## 2023-10-19 MED ORDER — LORATADINE 10 MG PO TABS: 10.0000 mg | ORAL_TABLET | Freq: Every day | ORAL | 11 refills | Status: DC

## 2023-10-19 MED ORDER — METFORMIN HCL ER 500 MG PO TB24
1000.0000 mg | ORAL_TABLET | Freq: Every day | ORAL | 1 refills | Status: DC
Start: 2023-10-19 — End: 2023-12-03

## 2023-10-19 NOTE — Assessment & Plan Note (Signed)
 Patient counseled on low-carb diet Encouraged to utilize pillbox to assist with medication adherence as planned Start metformin 1000 mg daily, glipizide XL 5 mg daily Patient counseled on low-carb diet Up-to-date with diabetic eye exam records requested Appreciate collaboration with the clinical pharmacist We will recheck A1c at next visit

## 2023-10-19 NOTE — Assessment & Plan Note (Signed)
 Patient educated on CDC recommendation for the vaccine. Verbal consent was obtained from the patient, vaccine administered by nurse, no sign of adverse reactions noted at this time. Patient education on arm soreness and use of tylenol  for this patient  was discussed. Patient educated on the signs and symptoms of adverse effect and advise to contact the office if they occur.  ?

## 2023-10-19 NOTE — Assessment & Plan Note (Signed)
 Lab Results  Component Value Date   CHOL 97 05/17/2023   HDL 23 (L) 05/17/2023   LDLCALC 49 05/17/2023   LDLDIRECT 91 08/23/2023   TRIG 123 05/17/2023   CHOLHDL 4.2 05/17/2023  Continue atorvastatin 40 mg daily Avoid fatty fried foods

## 2023-10-19 NOTE — Progress Notes (Signed)
 Established Patient Office Visit  Subjective:  Patient ID: Nicholas Johnston, male    DOB: Nov 02, 1955  Age: 68 y.o. MRN: [id number]  CC:  Chief Complaint  Patient presents with   Diabetes    Here to review his medications and follow up    HPI Nicholas Johnston is a 68 y.o. male  has a past medical history of Acute pyelonephritis (05/17/2023), Diabetes mellitus without complication (HCC), Dyslipidemia, goal LDL below 70 (05/26/2023), Hyperlipidemia, Insomnia (05/26/2023), and Uncontrolled type 2 diabetes mellitus with hyperglycemia (HCC) (05/26/2023).  Patient present for follow-up for his chronic medical conditions.  He is accompanied by his son.  They declined the use of a medical interpreter today  Uncontrolled type 2 diabetes.  Currently on metformin 500 mg daily, glipizide IR 5  mg twice daily.  The clinical pharmacist had recommended starting metformin 1000 mg daily and starting glipizide XL 5 mg  tablets due to complaints of hypoglycemia.  They have not picked up the new prescription for glipizide XL tablet.  Patient stated that his diet can be better.  No complaints of polyuria polydipsia polyphagia.  Takes atorvastatin 40 mg daily for hyperlipidemia His son plans getting a pill box so that he is able to fill medication weekly for the patient to help assist with medication adherence  Allergies.  Patient complains of nonproductive cough, sneezing, runny nose that started some days ago.  No fever, chills, chest pain, shortness of breath, wheezing  Due for flu vaccine pneumonia vaccine, shingles vaccine.  Flu vaccine and pneumonia vaccine given in the office today, plans for shingles vaccine at next visit.     Past Medical History:  Diagnosis Date   Acute pyelonephritis 05/17/2023   Diabetes mellitus without complication (HCC)    Dyslipidemia, goal LDL below 70 05/26/2023   Hyperlipidemia    Insomnia 05/26/2023   Uncontrolled type 2 diabetes mellitus with hyperglycemia (HCC) 05/26/2023     History reviewed. No pertinent surgical history.  Family History  Family history unknown: Yes    Social History   Socioeconomic History   Marital status: Married    Spouse name: Sa Nay   Number of children: 3   Years of education: Not on file   Highest education level: Not on file  Occupational History   Not on file  Tobacco Use   Smoking status: Never   Smokeless tobacco: Never  Vaping Use   Vaping status: Never Used  Substance and Sexual Activity   Alcohol use: Not Currently    Comment: occ   Drug use: No   Sexual activity: Not Currently  Other Topics Concern   Not on file  Social History Narrative   Lives with his family    Social Drivers of Corporate investment banker Strain: Not on file  Food Insecurity: No Food Insecurity (05/24/2023)   Hunger Vital Sign    Worried About Running Out of Food in the Last Year: Never true    Ran Out of Food in the Last Year: Never true  Transportation Needs: No Transportation Needs (05/24/2023)   PRAPARE - Administrator, Civil Service (Medical): No    Lack of Transportation (Non-Medical): No  Physical Activity: Not on file  Stress: Not on file  Social Connections: Not on file  Intimate Partner Violence: Not At Risk (05/17/2023)   Humiliation, Afraid, Rape, and Kick questionnaire    Fear of Current or Ex-Partner: No    Emotionally Abused: No    Physically Abused:  No    Sexually Abused: No    Outpatient Medications Prior to Visit  Medication Sig Dispense Refill   acetaminophen (TYLENOL) 500 MG tablet Take 1,000 mg by mouth 2 (two) times daily as needed for moderate pain, fever or headache.     atorvastatin (LIPITOR) 40 MG tablet Take 1 tablet (40 mg total) by mouth daily. 90 tablet 1   Blood Glucose Monitoring Suppl DEVI 1 each by Does not apply route 3 (three) times daily. May dispense any manufacturer covered by patient's insurance. 1 each 0   glipiZIDE (GLUCOTROL XL) 5 MG 24 hr tablet Take 1 tablet (5 mg  total) by mouth daily with breakfast. 30 tablet 2   Glucose Blood (BLOOD GLUCOSE TEST STRIPS) STRP 1 each by Does not apply route 3 (three) times daily. Use as directed to check blood sugar. May dispense any manufacturer covered by patient's insurance and fits patient's device. 100 strip 0   Lancet Device MISC 1 each by Does not apply route 3 (three) times daily. May dispense any manufacturer covered by patient's insurance. 1 each 0   Lancets MISC 1 each by Does not apply route 3 (three) times daily. Use as directed to check blood sugar. May dispense any manufacturer covered by patient's insurance and fits patient's device. 100 each 0   levothyroxine (SYNTHROID) 25 MCG tablet Take 1 tablet (25 mcg total) by mouth every morning. 90 tablet 1   metFORMIN (GLUCOPHAGE-XR) 500 MG 24 hr tablet Take 1 tablet (500 mg total) by mouth 2 (two) times daily with a meal. 60 tablet 3   Insulin Pen Needle (PEN NEEDLES) 31G X 5 MM MISC 1 each by Does not apply route 3 (three) times daily. May dispense any manufacturer covered by patient's insurance. (Patient not taking: Reported on 10/19/2023) 100 each 0   melatonin 5 MG TABS Take 1 tablet (5 mg total) by mouth at bedtime as needed. (Patient not taking: Reported on 10/19/2023) 60 tablet 0   No facility-administered medications prior to visit.    No Known Allergies  ROS Review of Systems  Constitutional:  Negative for appetite change, chills, fatigue and fever.  HENT:  Positive for rhinorrhea and sneezing. Negative for postnasal drip.   Respiratory:  Positive for cough. Negative for shortness of breath and wheezing.   Cardiovascular:  Negative for chest pain, palpitations and leg swelling.  Gastrointestinal:  Negative for abdominal pain, constipation, nausea and vomiting.  Genitourinary:  Negative for difficulty urinating, dysuria, flank pain and frequency.  Musculoskeletal:  Negative for arthralgias, back pain, joint swelling and myalgias.  Skin:  Negative for  color change, pallor, rash and wound.  Neurological:  Negative for dizziness, facial asymmetry, weakness, numbness and headaches.  Psychiatric/Behavioral:  Negative for behavioral problems, confusion, self-injury and suicidal ideas.       Objective:    Physical Exam Vitals and nursing note reviewed.  Constitutional:      General: He is not in acute distress.    Appearance: Normal appearance. He is not ill-appearing, toxic-appearing or diaphoretic.  HENT:     Nose: Rhinorrhea present.     Mouth/Throat:     Mouth: Mucous membranes are moist.     Pharynx: Oropharynx is clear. No oropharyngeal exudate or posterior oropharyngeal erythema.  Eyes:     General: No scleral icterus.       Right eye: No discharge.        Left eye: No discharge.     Extraocular Movements: Extraocular movements intact.  Conjunctiva/sclera: Conjunctivae normal.  Cardiovascular:     Rate and Rhythm: Normal rate and regular rhythm.     Pulses: Normal pulses.     Heart sounds: Normal heart sounds. No murmur heard.    No friction rub. No gallop.  Pulmonary:     Effort: Pulmonary effort is normal. No respiratory distress.     Breath sounds: Normal breath sounds. No stridor. No wheezing, rhonchi or rales.  Chest:     Chest wall: No tenderness.  Abdominal:     General: There is no distension.     Palpations: Abdomen is soft.     Tenderness: There is no abdominal tenderness. There is no right CVA tenderness, left CVA tenderness or guarding.  Musculoskeletal:        General: No swelling, tenderness, deformity or signs of injury.     Right lower leg: No edema.     Left lower leg: No edema.  Skin:    General: Skin is warm and dry.     Capillary Refill: Capillary refill takes less than 2 seconds.     Coloration: Skin is not jaundiced or pale.     Findings: No bruising, erythema or lesion.  Neurological:     Mental Status: He is alert and oriented to person, place, and time.     Motor: No weakness.      Coordination: Coordination normal.     Gait: Gait normal.  Psychiatric:        Mood and Affect: Mood normal.        Behavior: Behavior normal.        Thought Content: Thought content normal.        Judgment: Judgment normal.     BP 126/85   Pulse 91   Temp 98.8 F (37.1 C)   Wt 153 lb 9.6 oz (69.7 kg)   SpO2 99%   BMI 25.56 kg/m  Wt Readings from Last 3 Encounters:  10/19/23 153 lb 9.6 oz (69.7 kg)  09/20/23 152 lb (68.9 kg)  08/23/23 151 lb (68.5 kg)    Lab Results  Component Value Date   TSH 1.929 05/17/2023   Lab Results  Component Value Date   WBC 6.2 08/23/2023   HGB 13.5 08/23/2023   HCT 41.8 08/23/2023   MCV 87 08/23/2023   PLT 203 08/23/2023   Lab Results  Component Value Date   NA 136 08/23/2023   K 4.3 08/23/2023   CO2 24 08/23/2023   GLUCOSE 284 (H) 08/23/2023   BUN 21 08/23/2023   CREATININE 1.15 08/23/2023   BILITOT 0.7 05/17/2023   ALKPHOS 84 05/17/2023   AST 15 05/17/2023   ALT 28 05/17/2023   PROT 7.0 05/17/2023   ALBUMIN 3.0 (L) 05/17/2023   CALCIUM 9.0 08/23/2023   ANIONGAP 12 05/20/2023   EGFR 71 08/23/2023   Lab Results  Component Value Date   CHOL 97 05/17/2023   Lab Results  Component Value Date   HDL 23 (L) 05/17/2023   Lab Results  Component Value Date   LDLCALC 49 05/17/2023   Lab Results  Component Value Date   TRIG 123 05/17/2023   Lab Results  Component Value Date   CHOLHDL 4.2 05/17/2023   Lab Results  Component Value Date   HGBA1C 11.5 (A) 08/23/2023      Assessment & Plan:   Problem List Items Addressed This Visit       Respiratory   Allergic rhinitis   Start Claritin 10 mg daily Avoid  allergens      Relevant Medications   loratadine (CLARITIN) 10 MG tablet     Endocrine   Uncontrolled type 2 diabetes mellitus with hyperglycemia (HCC)   Patient counseled on low-carb diet Encouraged to utilize pillbox to assist with medication adherence as planned Start metformin 1000 mg daily, glipizide XL  5 mg daily Patient counseled on low-carb diet Up-to-date with diabetic eye exam records requested Appreciate collaboration with the clinical pharmacist We will recheck A1c at next visit       Relevant Medications   metFORMIN (GLUCOPHAGE-XR) 500 MG 24 hr tablet     Other   Dyslipidemia, goal LDL below 70   Lab Results  Component Value Date   CHOL 97 05/17/2023   HDL 23 (L) 05/17/2023   LDLCALC 49 05/17/2023   LDLDIRECT 91 08/23/2023   TRIG 123 05/17/2023   CHOLHDL 4.2 05/17/2023  Continue atorvastatin 40 mg daily Avoid fatty fried foods      Need for pneumococcal 20-valent conjugate vaccination   Patient educated on CDC recommendation for the vaccine. Verbal consent was obtained from the patient, vaccine administered by nurse, no sign of adverse reactions noted at this time. Patient education on arm soreness and use of tylenol  for this patient  was discussed. Patient educated on the signs and symptoms of adverse effect and advise to contact the office if they occur.       Relevant Orders   Pneumococcal conjugate vaccine 20-valent (Prevnar 20) (Completed)   Need for influenza vaccination - Primary   Patient educated on CDC recommendation for the vaccine. Verbal consent was obtained from the patient, vaccine administered by nurse, no sign of adverse reactions noted at this time. Patient education on arm soreness and use of tylenol  for this patient  was discussed. Patient educated on the signs and symptoms of adverse effect and advise to contact the office if they occur.       Relevant Orders   Flu vaccine trivalent PF, 6mos and older(Flulaval,Afluria,Fluarix,Fluzone) (Completed)   Immunization due   Relevant Orders   Td vaccine greater than or equal to 7yo preservative free IM (Completed)   Pneumococcal conjugate vaccine 20-valent (Prevnar 20) (Completed)    Meds ordered this encounter  Medications   metFORMIN (GLUCOPHAGE-XR) 500 MG 24 hr tablet    Sig: Take 2 tablets  (1,000 mg total) by mouth daily with breakfast.    Dispense:  180 tablet    Refill:  1   loratadine (CLARITIN) 10 MG tablet    Sig: Take 1 tablet (10 mg total) by mouth daily.    Dispense:  30 tablet    Refill:  11    Follow-up: Return in about 5 weeks (around 11/23/2023) for DM, HYPERLIPIDEMIA.    Donell Beers, FNP

## 2023-10-19 NOTE — Assessment & Plan Note (Signed)
Start Claritin 10 mg daily Avoid allergens

## 2023-10-19 NOTE — Patient Instructions (Signed)
 Goal for fasting blood sugar ranges from 80 to 120 and 2 hours after any meal or at bedtime should be between 130 to 170.   1. Need for influenza vaccination (Primary)   2. Need for pneumococcal 20-valent conjugate vaccination   3. Uncontrolled type 2 diabetes mellitus with hyperglycemia (HCC)  - metFORMIN (GLUCOPHAGE-XR) 500 MG 24 hr tablet; Take 2 tablets (1,000 mg total) by mouth daily with breakfast.  Dispense: 180 tablet; Refill: 1    It is important that you exercise regularly at least 30 minutes 5 times a week as tolerated  Think about what you will eat, plan ahead. Choose " clean, green, fresh or frozen" over canned, processed or packaged foods which are more sugary, salty and fatty. 70 to 75% of food eaten should be vegetables and fruit. Three meals at set times with snacks allowed between meals, but they must be fruit or vegetables. Aim to eat over a 12 hour period , example 7 am to 7 pm, and STOP after  your last meal of the day. Drink water,generally about 64 ounces per day, no other drink is as healthy. Fruit juice is best enjoyed in a healthy way, by EATING the fruit.  Thanks for choosing [facility] we consider it a privelige to serve you.

## 2023-10-26 NOTE — Telephone Encounter (Signed)
 Marland Kitchen

## 2023-11-07 ENCOUNTER — Other Ambulatory Visit: Payer: Self-pay

## 2023-11-07 ENCOUNTER — Other Ambulatory Visit (HOSPITAL_COMMUNITY): Payer: Self-pay

## 2023-11-07 DIAGNOSIS — E785 Hyperlipidemia, unspecified: Secondary | ICD-10-CM

## 2023-11-07 NOTE — Progress Notes (Unsigned)
 11/07/2023 Name: Nicholas Johnston MRN: [id number] DOB: 1956-05-09  Chief Complaint  Patient presents with   Diabetes   Hyperlipidemia    Kimball Appleby is a 68 y.o. year old male who presented for a telephone visit.   They were referred to the pharmacist by their PCP for assistance in managing diabetes. Visit conducted with assistance from [facility] interpreter [phone].  Subjective: Patient reports doing well today. He was with his younger son today who assisted with the visit today. He also has an older son who I spoke with last time. He said his older son is the one who usually helps with the medications. They confirmed his medications with the patient's medication bottles he has at home.  Care Team: Primary Care Provider: Donell Beers, FNP ; Next Scheduled Visit: 12/01/23  Medication Access/Adherence  Current Pharmacy:  [id number] [place], [place] - [address] AT [facility] [address] [facility] [address] [place] [address] Phone: [phone] Fax: [phone]  [place] [facility] [address] [place] [address] Phone: [phone] Fax: [phone]  Patient reports affordability concerns with their medications: No  Patient reports access/transportation concerns to their pharmacy: No  Patient reports adherence concerns with their medications:  Yes  - difficulty following medication instructions. Patient's son said that he has not started using a pill box yet, but he thinks that they do have one.   Diabetes:  Current medications: metformin XR 500 mg - 2 tablets once daily, glipizide XL 5 mg daily Medications tried in the past: Semglee, Novolog (stopped d/t pt inability to self inject insulin correctly due to impaired vision, patient also noted he was unable to afford $35 copay)   Patient reports he is now taking metformin as prescribed, but he is incorrectly taking glipizide XL 5 mg and glipizide IR 5 mg daily. Has had a  3-4x since last visit where he felt shakey, sweaty, dizzy. Says that he had something to eat and felt better.  Son assisted with reviewing glucometer readings today: 3/23 10:21 AM - 182  3/22 6:56 AM -193 3/21 8:18 PM - 95 3/21 4:35 PM - 176 3/21 6:00 AM -138 3/20 3:52 PM - 165 3/19 8:35 PM - 218 3/19 4:21 PM - 51 3/19 11:20 AM - 131 3/18 9:35 PM 257 3/18 4:00 PM - 231 3/17 4:00 PM - 93 3/16 6:43 AM - 177 3/15 9:49 PM - 140 3/15 10:20 PM - 94  3/14 4:32 PM - 152 3/13 4:44 PM - 138  Patient reports hypoglycemic s/sx including dizziness, shakiness, sweating. Patient denies hyperglycemic symptoms including polyuria, polydipsia, polyphagia, nocturia, neuropathy, blurred vision.  Current meal patterns:  - Occasional morning snacks: muffin, coffee  11 AM and 1 PM meals - 11:00 AM: bread, green tea, "low sugar beverage" - 1:00 PM: hamburger, chicken wings, rice and curry  - Drinks: sometimes soda, but never drinks a whole bottle, 5 bottles of water per day   Hyperlipidemia/ASCVD Risk Reduction  Current lipid lowering medications: atorvastatin 40 mg daily  Patient reports adherence to medication as prescribed.  ASCVD History: none Family History: T2DM, HLD   Objective:  Lab Results  Component Value Date   HGBA1C 11.5 (A) 08/23/2023    Lab Results  Component Value Date   CREATININE 1.15 08/23/2023   BUN 21 08/23/2023   NA 136 08/23/2023   K 4.3 08/23/2023   CL 98 08/23/2023   CO2 24  08/23/2023    Lab Results  Component Value Date   CHOL 97 05/17/2023   HDL 23 (L) 05/17/2023   LDLCALC 49 05/17/2023   LDLDIRECT 91 08/23/2023   TRIG 123 05/17/2023   CHOLHDL 4.2 05/17/2023    Medications Reviewed Today     Reviewed by Vela Prose, RPH (Pharmacist) on 11/07/23 at 1408  Med List Status: <None>   Medication Order Taking? Sig Documenting Provider Last Dose Status Informant  acetaminophen (TYLENOL) 500 MG tablet [id number]  Take 1,000 mg by mouth 2 (two)  times daily as needed for moderate pain, fever or headache. [provider]  Active Self, Family Member           Med Note Sherilyn Cooter, [facility]   Mon Sep 20, 2023  3:58 PM) Prn   atorvastatin (LIPITOR) 40 MG tablet [id number] Yes Take 1 tablet (40 mg total) by mouth daily. Donell Beers, FNP Taking Active   Blood Glucose Monitoring Suppl DEVI [id number]  1 each by Does not apply route 3 (three) times daily. May dispense any manufacturer covered by patient's insurance. Glade Lloyd, MD  Active   glipiZIDE (GLUCOTROL XL) 5 MG 24 hr tablet [id number] Yes Take 1 tablet (5 mg total) by mouth daily with breakfast. Donell Beers, FNP Taking Active   Glucose Blood (BLOOD GLUCOSE TEST STRIPS) STRP [id number]  1 each by Does not apply route 3 (three) times daily. Use as directed to check blood sugar. May dispense any manufacturer covered by patient's insurance and fits patient's device. Glade Lloyd, MD  Active   Insulin Pen Needle (PEN NEEDLES) 31G X 5 MM MISC [id number]  1 each by Does not apply route 3 (three) times daily. May dispense any manufacturer covered by patient's insurance.  Patient not taking: Reported on 10/19/2023   Glade Lloyd, MD  Active   Lancet Device MISC [id number]  1 each by Does not apply route 3 (three) times daily. May dispense any manufacturer covered by patient's insurance. Glade Lloyd, MD  Active   Lancets MISC [id number]  1 each by Does not apply route 3 (three) times daily. Use as directed to check blood sugar. May dispense any manufacturer covered by patient's insurance and fits patient's device. Glade Lloyd, MD  Active   levothyroxine (SYNTHROID) 25 MCG tablet [id number] Yes Take 1 tablet (25 mcg total) by mouth every morning. Donell Beers, FNP Taking Active   loratadine (CLARITIN) 10 MG tablet [id number] No Take 1 tablet (10 mg total) by mouth daily.  Patient not taking: Reported on 11/07/2023   Donell Beers, FNP Not Taking Active   melatonin 5  MG TABS [id number]  Take 1 tablet (5 mg total) by mouth at bedtime as needed.  Patient not taking: Reported on 10/19/2023   Donell Beers, FNP  Active   metFORMIN (GLUCOPHAGE-XR) 500 MG 24 hr tablet [id number] Yes Take 2 tablets (1,000 mg total) by mouth daily with breakfast. Donell Beers, FNP Taking Active               Assessment/Plan:   Diabetes: - Currently uncontrolled based on last A1c 11.5% on 08/23/23. However, home BG readings reveal improvement. Patient still having some hypoglycemia likely due to taking glipizide XL and glipizide IR, despite teach back method with his older son last visit stating to discontinue IR glipizide. Patients son confirmed that they disposed of the IR glipizide while we were on the phone so there should not be any further confusion. Will continue  glipizide XL and metformin at current doses for now and consider maximizing metformin dose once hypoglycemia resolves as some BG readings are still elevated. Utilized teach back method to ensure understanding of medication regimen. In the future, will consider optimizing with GLP-1 to lower risk of hypoglycemia and improve glycemic control as needed. Would need to consider cost given patient reported previous $35 insulin copay was too expensive and if family member is willing to help with injection. - Reviewed long term cardiovascular and renal outcomes of uncontrolled blood sugar - Reviewed appropriate treatment of hypoglycemia - Reviewed goal A1c, goal fasting, and goal 2 hour post prandial glucose - Recommend to continue metformin XR 500 mg - 2 tablets daily - Recommend to continue glipizide XL 5 mg daily - Recommend to stop glipizide IR  as previously recommended - Recommend to check glucose fasting and 2 hr post prandial blood glucose daily     Hyperlipidemia/ASCVD Risk Reduction: - Currently uncontrolled with last LDL 91 mg/dL, above goal <16 mg/dL given DM. Patient likely will need addition of  ezetimibe to reach goal. Given adherence issues, will increase the dose of atorvastatin to 80 mg to minimize pill burden first. - Reviewed long term complications of uncontrolled cholesterol - Recommend to increase atorvastatin to 80 mg daily - F/u lipid panel due in 2-3 months    Medication Management - Recommended to start utilizing a pill box to help organize medications. Encouraged patient's son to assist with setting this up and filling pill box each week to ensure adherence to medications as prescribed. He expressed understanding and said he would do so.    Follow Up Plan: PharmD on 11/14/23 and PCP on 12/01/23  Jarrett Ables, PharmD PGY-1 Pharmacy Resident

## 2023-11-09 MED ORDER — ATORVASTATIN CALCIUM 80 MG PO TABS: 80.0000 mg | ORAL_TABLET | Freq: Every day | ORAL | 3 refills | Status: DC

## 2023-11-14 ENCOUNTER — Other Ambulatory Visit (HOSPITAL_COMMUNITY): Payer: Self-pay

## 2023-11-14 ENCOUNTER — Other Ambulatory Visit: Payer: Self-pay

## 2023-11-14 NOTE — Progress Notes (Signed)
 3/30/Johnston Name: Nicholas Johnston MRN: [id number] DOB: May 20, 1956  Chief Complaint  Patient presents with   Diabetes   Hyperlipidemia    Nicholas Johnston is a 68 y.o. year old male who presented for a telephone visit. PMH includes T2DM, HLD.    They were referred to the pharmacist by their PCP for assistance in managing diabetes. Visit conducted with assistance from Nicholas Johnston interpreter [phone].  Subjective: Patient reports doing well today. He is at temple and his son is not initially available, but arrived later in the call. Medication bottles were not available during the appointment, but were confirmed during previous appointment.   Care Team: Primary Care Provider: Donell Beers, FNP ; Next Scheduled Visit: 12/01/23 - Patient's son reports they will be able to go to this appointment.   Medication Access/Adherence  Current Pharmacy:  [facility][id number] Ginette Otto, [place] - [address] AT [facility] [address] & [facility] [address] [place] [address] Phone: [phone] Fax: [phone]  [facility] [address] [place] [address] Phone: [phone] Fax: [phone]  Patient reports affordability concerns with their medications: No  Patient reports access/transportation concerns to their pharmacy: No  Patient reports adherence concerns with their medications:  Yes  - difficulty following medication instructions. Patient's son said that he did not start using a pill box, because [name] does not feel that he needs it.    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 He previously was enrolled in [facility] (Oct 2024). Patient's son reports that they did receive some Medicaid forms in the mail, but he is not sure what they say. Suspect that Medicaid lapsed, and may be difficult to re-enroll given age 43 - however, encouraged son to call number provided  on the forms to investigate, or bring to upcoming PCP appointment for interpretation.   Diabetes:  Current medications: metformin XR 500 mg - 2 tablets once daily, glipizide XL 5 mg daily Medications tried in the past: Semglee, Novolog (stopped d/t pt inability to self inject insulin correctly due to impaired vision, patient also noted he was unable to afford $35 copay), glipizide IR (hypoglycemia)  Glucometer not available for review today, as they were not at home. Patient reports recent FBG of 114, 85, 107. Reports BG are similar in the afternoon when he gets home from work (3:40PM), but does report two readings from this past week < 70 mg/dL like 65, 68 - as described above. Denied BG > 200 mg/dL.   Patient/son report taking glipizide and metformin as prescribed. Patient reports that he has not felt symptoms of hypoglycemia since phone call with pharmacy ~1 week ago,despite reporting some BG < 70 mg/dL  Patient denies hyperglycemic symptoms including polyuria, nocturia, neuropathy, blurred vision. States that he did have polyphagia and polydipsia after eating more rice and BG ~200 mg/dL.   Current meal patterns:  - Occasional morning snacks: muffin, coffee  11 AM and 1 PM meals - 11:00 AM: bread, green tea, "low sugar beverage" - 1:00 PM: hamburger, chicken wings, rice and curry  - Drinks: sometimes soda, but never drinks a whole bottle, 5 bottles of water per day   Hyperlipidemia/ASCVD Risk Reduction  Current lipid lowering medications: atorvastatin 80 mg daily (still taking atorvastatin 40 mg daily, since they were waiting to hear about medication change).   Informed patent/son that atorvastatin 80 mg  appeared to be ready for pickup at [facility]. Instructed son to dispose of atorvastatin 40 mg tablets to reduce confusion.   ASCVD History: none Family History: T2DM, HLD   Objective:  Lab Results  Component Value Date   HGBA1C 11.5 (A) 01/06/Johnston    Lab Results  Component  Value Date   CREATININE 1.15 01/06/Johnston   BUN 21 01/06/Johnston   NA 136 01/06/Johnston   K 4.3 01/06/Johnston   CL 98 01/06/Johnston   CO2 24 01/06/Johnston    Lab Results  Component Value Date   CHOL 97 05/17/2023   HDL 23 (L) 05/17/2023   LDLCALC 49 05/17/2023   LDLDIRECT 91 01/06/Johnston   TRIG 123 05/17/2023   CHOLHDL 4.2 05/17/2023    Medications Reviewed Today     Reviewed by Nicholas Johnston, RPH (Pharmacist) on 11/14/23 at 1522  Med List Status: <None>   Medication Order Taking? Sig Documenting Provider Last Dose Status Informant  acetaminophen (TYLENOL) 500 MG tablet [id number]  Take 1,000 mg by mouth 2 (two) times daily as needed for moderate pain, fever or headache. [provider]  Active Self, Family Member           Med Note Nicholas Johnston, [facility]   Nicholas Johnston  3:58 PM) Prn   atorvastatin (LIPITOR) 80 MG tablet [id number] No Take 1 tablet (80 mg total) by mouth daily.  Patient not taking: Reported on 3/30/Johnston   Nicholas Beers, FNP Not Taking Active            Med Note Nicholas Johnston Mar 30, Johnston 12:27 PM) Still taking 40 mg - has not picked up 80 mg tablets yet  Blood Glucose Monitoring Suppl DEVI [id number]  1 each by Does not apply route 3 (three) times daily. May dispense any manufacturer covered by patient's insurance. Glade Lloyd, MD  Active   glipiZIDE (GLUCOTROL XL) 5 MG 24 hr tablet [id number] Yes Take 1 tablet (5 mg total) by mouth daily with breakfast. Nicholas Beers, FNP Taking Active   Glucose Blood (BLOOD GLUCOSE TEST STRIPS) STRP [id number]  1 each by Does not apply route 3 (three) times daily. Use as directed to check blood sugar. May dispense any manufacturer covered by patient's insurance and fits patient's device. Glade Lloyd, MD  Active   Insulin Pen Needle (PEN NEEDLES) 31G X 5 MM MISC [id number]  1 each by Does not apply route 3 (three) times daily. May dispense any manufacturer covered by patient's insurance.  Patient not taking: Reported on  3/4/Johnston   Glade Lloyd, MD  Active   Lancet Device MISC [id number]  1 each by Does not apply route 3 (three) times daily. May dispense any manufacturer covered by patient's insurance. Glade Lloyd, MD  Active   Lancets MISC [id number]  1 each by Does not apply route 3 (three) times daily. Use as directed to check blood sugar. May dispense any manufacturer covered by patient's insurance and fits patient's device. Glade Lloyd, MD  Active   levothyroxine (SYNTHROID) 25 MCG tablet [id number] Yes Take 1 tablet (25 mcg total) by mouth every morning. Nicholas Beers, FNP Taking Active   loratadine (CLARITIN) 10 MG tablet [id number]  Take 1 tablet (10 mg total) by mouth daily.  Patient not taking: Reported on 3/23/Johnston   Nicholas Beers, FNP  Active   melatonin 5 MG TABS [id number]  Take 1 tablet (5 mg total) by mouth at bedtime as needed.  Patient not taking:  Reported on 3/4/Johnston   Nicholas Beers, FNP  Active   metFORMIN (GLUCOPHAGE-XR) 500 MG 24 hr tablet [id number] Yes Take 2 tablets (1,000 mg total) by mouth daily with breakfast. Nicholas Beers, FNP Taking Active               Assessment/Plan:   Diabetes: - Currently uncontrolled based on last A1c 11.5% on 08/23/23. However, home BG readings are much improved. Though patient continued to report some BG < 70 mg/dL, likely associated with sulfonylurea use, patient has not been symptomatic and overall frequency and severity of hypoglycemia is reduced. Would prefer to transition patient from SU to GLP-1RA, however it is not feasible to initiate over the phone today given language barrier, vision impairment, and history of confusion with medication changes. Also anticipate cost concerns, as patient no longer has medicaid and previously reported that $35/mo insulin is not affordable. Appears that Rybelsus is $14.99/mo through insurance (and patient may be eligible for $10 coupon card). Will continue current regimen. Could consider  increasing metformin if eGFR remains > 45 mL/min on repeat BMET.  - Reviewed long term cardiovascular and renal outcomes of uncontrolled blood sugar - Reviewed appropriate treatment of hypoglycemia - Reviewed goal A1c, goal fasting, and goal 2 hour post prandial glucose - Recommend to continue metformin XR 500 mg - 2 tablets daily - Recommend to continue glipizide XL 5 mg daily - Recommend to check glucose fasting and 2 hr post prandial blood glucose daily - Can consider initiation of Rybelsus vs injectable GLP-1 RA at follow-up with appropriate administration counseling,and confirmation of  no hx of pancreatitis or family hx of MEN2, MTC.   Hyperlipidemia/ASCVD Risk Reduction: - Currently uncontrolled with last LDL 91 mg/dL, above goal <40 mg/dL given DM. Patient likely will need addition of ezetimibe to reach goal. Given adherence issues, will increase the dose of atorvastatin to 80 mg to minimize pill burden first. - Reviewed long term complications of uncontrolled cholesterol - Recommend to increase atorvastatin to 80 mg daily as previously instructed. Patient and son aware to replace atorvastatin 40 mg tablets.  - F/u lipid panel due ~July Johnston  Medication Management - Previously recommended to start utilizing a pill box to help organize medications. Patient's son reported that patient did not feel he needed to use a pill box. Will continue to monitor for appropriate medication administration.  - Educated on looking into lapse in [place] and whether he is eligible for renewal.   Follow Up Plan: PCP 12/01/23, Pharmacist telephone 01/02/24  Nils Pyle, PharmD PGY1 Pharmacy Resident

## 2023-11-30 ENCOUNTER — Telehealth: Payer: Self-pay | Admitting: Nurse Practitioner

## 2023-12-01 ENCOUNTER — Encounter: Payer: Self-pay | Admitting: Nurse Practitioner

## 2023-12-01 ENCOUNTER — Ambulatory Visit (INDEPENDENT_AMBULATORY_CARE_PROVIDER_SITE_OTHER): Payer: Self-pay | Admitting: Nurse Practitioner

## 2023-12-01 VITALS — BP 111/79 | HR 74 | Temp 97.0°F | Wt 155.0 lb

## 2023-12-01 DIAGNOSIS — E039 Hypothyroidism, unspecified: Secondary | ICD-10-CM

## 2023-12-01 DIAGNOSIS — E1165 Type 2 diabetes mellitus with hyperglycemia: Secondary | ICD-10-CM

## 2023-12-01 DIAGNOSIS — E785 Hyperlipidemia, unspecified: Secondary | ICD-10-CM

## 2023-12-01 LAB — POCT GLYCOSYLATED HEMOGLOBIN (HGB A1C): Hemoglobin A1C: 9.2 % — AB (ref 4.0–5.6)

## 2023-12-01 MED ORDER — LANCET DEVICE MISC: 1.0000 | Freq: Two times a day (BID) | 0 refills | Status: AC

## 2023-12-01 MED ORDER — LANCETS MISC. MISC: 1.0000 | Freq: Two times a day (BID) | 0 refills | Status: AC

## 2023-12-01 MED ORDER — BLOOD GLUCOSE TEST VI STRP: 1.0000 | ORAL_STRIP | Freq: Two times a day (BID) | 4 refills | Status: DC

## 2023-12-01 MED ORDER — BLOOD GLUCOSE MONITORING SUPPL DEVI: 1.0000 | Freq: Two times a day (BID) | 0 refills | Status: DC

## 2023-12-01 NOTE — Telephone Encounter (Signed)
Please disgard

## 2023-12-01 NOTE — Assessment & Plan Note (Signed)
 Lab Results  Component Value Date   CHOL 97 05/17/2023   HDL 23 (L) 05/17/2023   LDLCALC 49 05/17/2023   LDLDIRECT 91 08/23/2023   TRIG 123 05/17/2023   CHOLHDL 4.2 05/17/2023  Continue atorvastatin 80 mg daily We will recheck labs at next visit

## 2023-12-01 NOTE — Assessment & Plan Note (Signed)
 Lab Results  Component Value Date   HGBA1C 9.2 (A) 12/01/2023  Much improved but not at goal of less than 7 Continue glipizide 5 mg daily, will increase metformin to 1000 mg twice daily if EGFR remains greater than 45.  Patient encouraged to take medication with food to prevent hypoglycemia. Patient counseled on low-carb diet Appreciate collaboration with the clinical pharmacist Follow-up in 3 months

## 2023-12-01 NOTE — Assessment & Plan Note (Addendum)
 Lab Results  Component Value Date   TSH 1.929 05/17/2023  On levothyroxine 25 mcg daily Checking TSH T4 levels today Med was last taken yesterday

## 2023-12-01 NOTE — Progress Notes (Signed)
 Established Patient Office Visit  Subjective:  Patient ID: Nicholas Johnston, male    DOB: [birth year]  Age: 68 y.o. MRN: [id number]  CC:  Chief Complaint  Patient presents with   Hyperlipidemia   Diabetes    HPI Nicholas Johnston is a 68 y.o. male  has a past medical history of Acute pyelonephritis (05/17/2023), Diabetes mellitus without complication (HCC), Dyslipidemia, goal LDL below 70 (05/26/2023), Hyperlipidemia, Insomnia (05/26/2023), and Uncontrolled type 2 diabetes mellitus with hyperglycemia (HCC) (05/26/2023).  Patient presents for follow-up for his chronic medical conditions  Type 2 diabetes.  Currently on 1 mg daily, glipizide 5 mg daily. Has had some low CBG in the 60 usually after work.  Has not been utilizing a pillbox but has been taking his medications as ordered.  No polyuria problems with polydipsia  Hyperlipidemia currently on atorvastatin 80 mg daily , started taking med after his last telephone visit with the pharmacist , was previously taking 40 mg daily  Patient is accompanied by his son, they declined the use of a medical interpreter.    Past Medical History:  Diagnosis Date   Acute pyelonephritis 05/17/2023   Diabetes mellitus without complication (HCC)    Dyslipidemia, goal LDL below 70 05/26/2023   Hyperlipidemia    Insomnia 05/26/2023   Uncontrolled type 2 diabetes mellitus with hyperglycemia (HCC) 05/26/2023    History reviewed. No pertinent surgical history.  Family History  Family history unknown: Yes    Social History   Socioeconomic History   Marital status: Married    Spouse name: Sa Nay   Number of children: 3   Years of education: Not on file   Highest education level: Not on file  Occupational History   Not on file  Tobacco Use   Smoking status: Never   Smokeless tobacco: Never  Vaping Use   Vaping status: Never Used  Substance and Sexual Activity   Alcohol use: Not Currently    Comment: occ   Drug use: No   Sexual activity: Not  Currently  Other Topics Concern   Not on file  Social History Narrative   Lives with his family    Social Drivers of Corporate investment banker Strain: Not on file  Food Insecurity: No Food Insecurity (05/24/2023)   Hunger Vital Sign    Worried About Running Out of Food in the Last Year: Never true    Ran Out of Food in the Last Year: Never true  Transportation Needs: No Transportation Needs (05/24/2023)   PRAPARE - Administrator, Civil Service (Medical): No    Lack of Transportation (Non-Medical): No  Physical Activity: Not on file  Stress: Not on file  Social Connections: Not on file  Intimate Partner Violence: Not At Risk (05/17/2023)   Humiliation, Afraid, Rape, and Kick questionnaire    Fear of Current or Ex-Partner: No    Emotionally Abused: No    Physically Abused: No    Sexually Abused: No    Outpatient Medications Prior to Visit  Medication Sig Dispense Refill   atorvastatin (LIPITOR) 80 MG tablet Take 1 tablet (80 mg total) by mouth daily. 90 tablet 3   glipiZIDE (GLUCOTROL XL) 5 MG 24 hr tablet Take 1 tablet (5 mg total) by mouth daily with breakfast. 30 tablet 2   Glucose Blood (BLOOD GLUCOSE TEST STRIPS) STRP 1 each by Does not apply route 3 (three) times daily. Use as directed to check blood sugar. May dispense any manufacturer covered by  patient's insurance and fits patient's device. 100 strip 0   Lancet Device MISC 1 each by Does not apply route 3 (three) times daily. May dispense any manufacturer covered by patient's insurance. 1 each 0   Lancets MISC 1 each by Does not apply route 3 (three) times daily. Use as directed to check blood sugar. May dispense any manufacturer covered by patient's insurance and fits patient's device. 100 each 0   levothyroxine (SYNTHROID) 25 MCG tablet Take 1 tablet (25 mcg total) by mouth every morning. 90 tablet 1   metFORMIN (GLUCOPHAGE-XR) 500 MG 24 hr tablet Take 2 tablets (1,000 mg total) by mouth daily with breakfast.  180 tablet 1   PROLENSA 0.07 % SOLN Place 1 drop into the right eye 2 (two) times daily.     Blood Glucose Monitoring Suppl DEVI 1 each by Does not apply route 3 (three) times daily. May dispense any manufacturer covered by patient's insurance. 1 each 0   acetaminophen (TYLENOL) 500 MG tablet Take 1,000 mg by mouth 2 (two) times daily as needed for moderate pain, fever or headache. (Patient not taking: Reported on 12/01/2023)     Insulin Pen Needle (PEN NEEDLES) 31G X 5 MM MISC 1 each by Does not apply route 3 (three) times daily. May dispense any manufacturer covered by patient's insurance. (Patient not taking: Reported on 09/20/2023) 100 each 0   loratadine (CLARITIN) 10 MG tablet Take 1 tablet (10 mg total) by mouth daily. (Patient not taking: Reported on 12/01/2023) 30 tablet 11   melatonin 5 MG TABS Take 1 tablet (5 mg total) by mouth at bedtime as needed. (Patient not taking: Reported on 06/23/2023) 60 tablet 0   No facility-administered medications prior to visit.    No Known Allergies  ROS Review of Systems  Constitutional:  Negative for appetite change, chills, fatigue and fever.  HENT:  Negative for congestion, postnasal drip, rhinorrhea and sneezing.   Respiratory:  Negative for cough, shortness of breath and wheezing.   Cardiovascular:  Negative for chest pain, palpitations and leg swelling.  Gastrointestinal:  Negative for abdominal pain, constipation, nausea and vomiting.  Genitourinary:  Negative for difficulty urinating, dysuria, flank pain and frequency.  Musculoskeletal:  Negative for arthralgias, back pain, joint swelling and myalgias.  Skin:  Negative for color change, pallor, rash and wound.  Neurological:  Negative for dizziness, facial asymmetry, weakness, numbness and headaches.  Psychiatric/Behavioral:  Negative for behavioral problems, confusion, self-injury and suicidal ideas.       Objective:    Physical Exam Vitals and nursing note reviewed.  Constitutional:       General: He is not in acute distress.    Appearance: Normal appearance. He is not ill-appearing, toxic-appearing or diaphoretic.  HENT:     Mouth/Throat:     Mouth: Mucous membranes are moist.     Pharynx: Oropharynx is clear. No oropharyngeal exudate or posterior oropharyngeal erythema.  Eyes:     General: No scleral icterus.       Right eye: No discharge.        Left eye: No discharge.     Extraocular Movements: Extraocular movements intact.     Conjunctiva/sclera: Conjunctivae normal.  Cardiovascular:     Rate and Rhythm: Normal rate and regular rhythm.     Pulses: Normal pulses.     Heart sounds: Normal heart sounds. No murmur heard.    No friction rub. No gallop.  Pulmonary:     Effort: Pulmonary effort is normal. No  respiratory distress.     Breath sounds: Normal breath sounds. No stridor. No wheezing, rhonchi or rales.  Chest:     Chest wall: No tenderness.  Abdominal:     General: There is no distension.     Palpations: Abdomen is soft.     Tenderness: There is no abdominal tenderness. There is no right CVA tenderness, left CVA tenderness or guarding.  Musculoskeletal:        General: No swelling, tenderness, deformity or signs of injury.     Right lower leg: No edema.     Left lower leg: No edema.  Skin:    General: Skin is warm and dry.     Capillary Refill: Capillary refill takes less than 2 seconds.     Coloration: Skin is not jaundiced or pale.     Findings: No bruising, erythema or lesion.  Neurological:     Mental Status: He is alert and oriented to person, place, and time.     Motor: No weakness.     Coordination: Coordination normal.     Gait: Gait normal.  Psychiatric:        Mood and Affect: Mood normal.        Behavior: Behavior normal.        Thought Content: Thought content normal.        Judgment: Judgment normal.     BP 111/79   Pulse 74   Temp (!) 97 F (36.1 C)   Wt 155 lb (70.3 kg)   SpO2 99%   BMI 25.79 kg/m  Wt Readings from  Last 3 Encounters:  12/01/23 155 lb (70.3 kg)  10/19/23 153 lb 9.6 oz (69.7 kg)  09/20/23 152 lb (68.9 kg)    Lab Results  Component Value Date   TSH 1.929 05/17/2023   Lab Results  Component Value Date   WBC 6.2 08/23/2023   HGB 13.5 08/23/2023   HCT 41.8 08/23/2023   MCV 87 08/23/2023   PLT 203 08/23/2023   Lab Results  Component Value Date   NA 136 08/23/2023   K 4.3 08/23/2023   CO2 24 08/23/2023   GLUCOSE 284 (H) 08/23/2023   BUN 21 08/23/2023   CREATININE 1.15 08/23/2023   BILITOT 0.7 05/17/2023   ALKPHOS 84 05/17/2023   AST 15 05/17/2023   ALT 28 05/17/2023   PROT 7.0 05/17/2023   ALBUMIN 3.0 (L) 05/17/2023   CALCIUM 9.0 08/23/2023   ANIONGAP 12 05/20/2023   EGFR 71 08/23/2023   Lab Results  Component Value Date   CHOL 97 05/17/2023   Lab Results  Component Value Date   HDL 23 (L) 05/17/2023   Lab Results  Component Value Date   LDLCALC 49 05/17/2023   Lab Results  Component Value Date   TRIG 123 05/17/2023   Lab Results  Component Value Date   CHOLHDL 4.2 05/17/2023   Lab Results  Component Value Date   HGBA1C 9.2 (A) 12/01/2023      Assessment & Plan:   Problem List Items Addressed This Visit       Endocrine   Uncontrolled type 2 diabetes mellitus with hyperglycemia (HCC) - Primary   Lab Results  Component Value Date   HGBA1C 9.2 (A) 12/01/2023  Much improved but not at goal of less than 7 Continue glipizide 5 mg daily, will increase metformin to 1000 mg twice daily if EGFR remains greater than 45.  Patient encouraged to take medication with food to prevent hypoglycemia. Patient counseled  on low-carb diet Appreciate collaboration with the clinical pharmacist Follow-up in 3 months      Relevant Orders   POCT glycosylated hemoglobin (Hb A1C) (Completed)   Basic Metabolic Panel   Hypothyroidism   Lab Results  Component Value Date   TSH 1.929 05/17/2023  On levothyroxine 25 mcg daily Checking TSH T4 levels today Med was last  taken yesterday       Relevant Orders   TSH + free T4     Other   Dyslipidemia, goal LDL below 70   Lab Results  Component Value Date   CHOL 97 05/17/2023   HDL 23 (L) 05/17/2023   LDLCALC 49 05/17/2023   LDLDIRECT 91 08/23/2023   TRIG 123 05/17/2023   CHOLHDL 4.2 05/17/2023  Continue atorvastatin 80 mg daily We will recheck labs at next visit       Meds ordered this encounter  Medications   Blood Glucose Monitoring Suppl DEVI    Sig: 1 each by Does not apply route in the morning and at bedtime. May substitute to any manufacturer covered by patient's insurance.    Dispense:  1 each    Refill:  0   Glucose Blood (BLOOD GLUCOSE TEST STRIPS) STRP    Sig: 1 each by In Vitro route 2 (two) times daily at 8 am and 10 pm. May substitute to any manufacturer covered by patient's insurance.    Dispense:  200 strip    Refill:  4   Lancet Device MISC    Sig: 1 each by Does not apply route in the morning and at bedtime. May substitute to any manufacturer covered by patient's insurance.    Dispense:  1 each    Refill:  0   Lancets Misc. MISC    Sig: 1 each by Does not apply route 2 (two) times daily at 8 am and 10 pm. May substitute to any manufacturer covered by patient's insurance.    Dispense:  200 each    Refill:  0    Follow-up: Return in about 3 months (around 03/01/2024) for DM.    Geovana Gebel R Loreto Loescher, FNP

## 2023-12-01 NOTE — Patient Instructions (Signed)
 Goal for fasting blood sugar ranges from 80 to 120 and 2 hours after any meal or at bedtime should be between 130 to 170.  We will let you know what changes we will be making to your medications once we have your lab results, someone will call you to tell you about the changes     It is important that you exercise regularly at least 30 minutes 5 times a week as tolerated  Think about what you will eat, plan ahead. Choose " clean, green, fresh or frozen" over canned, processed or packaged foods which are more sugary, salty and fatty. 70 to 75% of food eaten should be vegetables and fruit. Three meals at set times with snacks allowed between meals, but they must be fruit or vegetables. Aim to eat over a 12 hour period , example 7 am to 7 pm, and STOP after  your last meal of the day. Drink water,generally about 64 ounces per day, no other drink is as healthy. Fruit juice is best enjoyed in a healthy way, by EATING the fruit.  Thanks for choosing [facility] we consider it a privelige to serve you.

## 2023-12-02 LAB — BASIC METABOLIC PANEL WITH GFR
BUN/Creatinine Ratio: 17 (ref 10–24)
BUN: 19 mg/dL (ref 8–27)
CO2: 26 mmol/L (ref 20–29)
Calcium: 9.4 mg/dL (ref 8.6–10.2)
Chloride: 102 mmol/L (ref 96–106)
Creatinine, Ser: 1.09 mg/dL (ref 0.76–1.27)
Glucose: 168 mg/dL — ABNORMAL HIGH (ref 70–99)
Potassium: 4.8 mmol/L (ref 3.5–5.2)
Sodium: 140 mmol/L (ref 134–144)
eGFR: 75 mL/min/{1.73_m2} (ref >59)

## 2023-12-02 LAB — TSH+FREE T4
Free T4: 1.11 ng/dL (ref 0.82–1.77)
TSH: 2.26 u[IU]/mL (ref 0.450–4.500)

## 2023-12-03 ENCOUNTER — Other Ambulatory Visit: Payer: Self-pay | Admitting: Nurse Practitioner

## 2023-12-03 DIAGNOSIS — E039 Hypothyroidism, unspecified: Secondary | ICD-10-CM

## 2023-12-03 DIAGNOSIS — E1165 Type 2 diabetes mellitus with hyperglycemia: Secondary | ICD-10-CM

## 2023-12-03 MED ORDER — LEVOTHYROXINE SODIUM 25 MCG PO TABS: 25.0000 ug | ORAL_TABLET | Freq: Every morning | ORAL | 1 refills | Status: DC

## 2023-12-03 MED ORDER — METFORMIN HCL ER 500 MG PO TB24: 1000.0000 mg | ORAL_TABLET | Freq: Two times a day (BID) | ORAL | 3 refills | Status: DC

## 2023-12-03 MED ORDER — GLIPIZIDE ER 5 MG PO TB24
5.0000 mg | ORAL_TABLET | Freq: Every day | ORAL | 3 refills | Status: DC
Start: 2023-12-03 — End: 2023-12-28

## 2023-12-28 ENCOUNTER — Other Ambulatory Visit: Payer: Self-pay | Admitting: Nurse Practitioner

## 2023-12-28 DIAGNOSIS — E1165 Type 2 diabetes mellitus with hyperglycemia: Secondary | ICD-10-CM

## 2023-12-28 MED ORDER — GLIPIZIDE ER 5 MG PO TB24: 5.0000 mg | ORAL_TABLET | Freq: Every day | ORAL | 1 refills | Status: DC

## 2023-12-28 NOTE — Telephone Encounter (Signed)
 Please advise  due to D/C in feb. KH

## 2024-01-02 ENCOUNTER — Other Ambulatory Visit: Payer: Self-pay

## 2024-01-02 ENCOUNTER — Other Ambulatory Visit (HOSPITAL_COMMUNITY): Payer: Self-pay

## 2024-01-02 DIAGNOSIS — E1165 Type 2 diabetes mellitus with hyperglycemia: Secondary | ICD-10-CM

## 2024-01-02 NOTE — Progress Notes (Signed)
 01/02/2024 Name: Nicholas Johnston MRN: [id number] DOB: 08-21-1955  Chief Complaint  Patient presents with   Diabetes    Nicholas Johnston is a 68 y.o. year old male who presented for a telephone visit. PMH includes T2DM, HLD.    They were referred to the pharmacist by their PCP for assistance in managing diabetes. Visit conducted with assistance from [facility] interpreter [phone].  Subjective: Patient reports doing well today. He is aware of the improvement in his A1C. He confirms that he was told to increase metformin  to 2 tablets twice daily (1000 mg BID). Patient and son confirm that he has current supplies of all maintenance medications. His son expresses some concern that the medication labels they have from the pharmacy differ from most recently provided instructions. Appears that pharmacy has been filling older prescriptions for glipizide  and metformin  and atorvastatin .  Care Team: Primary Care Provider: [name], [name], FNP ; Next Scheduled Visit: 03/08/24  Medication Access/Adherence  Current Pharmacy:  [id number] Jonette Nestle, [place] - [address] AT [facility] [address] [facility] [address] [place] [address] Phone: [phone] Fax: [phone]  [place] [facility] [address] [place] [address] Phone: [phone] Fax: [phone]  Patient reports affordability concerns with their medications: No  Patient reports access/transportation concerns to their pharmacy: No  Patient reports adherence concerns with their medications:  Yes  - still taking glipizide  5 mg twice daily, instead of once daily because this is what the pharmacy recently dispensed. Still taking atorvastatin  40 mg instead of 80 mg, because this is what the pharmacy dispensed.   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 He previously was enrolled in [facility] (Oct 2024). Patient's son reports that they did receive some  Medicaid forms in the mail, asking for updated income information to determine whether he is eligible for continued coverage. They have not mailed these forms back yet.   Diabetes:  Current medications: metformin  XR 1000 mg BID, glipizide  XL 5 mg daily (taking twice daily) Medications tried in the past: Semglee , Novolog  (stopped d/t pt inability to self inject insulin  correctly due to impaired vision, patient also noted he was unable to afford $35 copay)glipizide  IR (hypoglycemia)  Glucometer available for review today. Checking daily fasting BG and occasional PPG.  12/27/23: 200  12/28/23: 118, 205  12/29/23: 209  12/30/23: 202, 96 12/31/23: 178 01/01/24 155, 184 01/02/24: 129   Patient reports that he has not felt symptoms of hypoglycemia since April. Had an episode of palpitations and dizziness - once he got home, BG was found to be < 70 mg/dL. This has not happened in a couple weeks.   Patient denies hyperglycemic symptoms including polyuria, nocturia, neuropathy, blurred vision.   Current meal patterns: Son reports that he is avoiding sugary foods and drinks, but he does eat rice everyday and this is difficult for him to cut back on.  - Occasional morning snacks: muffin, coffee  11 AM and 1 PM meals - 11:00 AM: bread, green tea, "low sugar beverage" - 1:00 PM: hamburger, chicken wings, rice and curry  - Drinks: sometimes soda, but never drinks a whole bottle, 5 bottles of water per day   Hyperlipidemia/ASCVD Risk Reduction  Current lipid lowering medications: atorvastatin  80 mg daily (did take 80 mg x1 mo, but pharmacy most recently dispensed 40 mg tablets)  Informed patent/son that atorvastatin  80 mg appeared to be ready  for pickup at [facility]. Instructed son to dispose of atorvastatin  40 mg tablets to reduce confusion.   ASCVD History: none Family History: T2DM, HLD   Objective:  Lab Results  Component Value Date   HGBA1C 9.2 (A) 12/01/2023    Lab Results  Component  Value Date   CREATININE 1.09 12/01/2023   BUN 19 12/01/2023   NA 140 12/01/2023   K 4.8 12/01/2023   CL 102 12/01/2023   CO2 26 12/01/2023    Lab Results  Component Value Date   CHOL 97 05/17/2023   HDL 23 (L) 05/17/2023   LDLCALC 49 05/17/2023   LDLDIRECT 91 08/23/2023   TRIG 123 05/17/2023   CHOLHDL 4.2 05/17/2023    Medications Reviewed Today     Reviewed by Adra Alanis, RPH (Pharmacist) on 01/02/24 at 1442  Med List Status: <None>   Medication Order Taking? Sig Documenting Provider Last Dose Status Informant  acetaminophen  (TYLENOL ) 500 MG tablet [id number]  Take 1,000 mg by mouth 2 (two) times daily as needed for moderate pain, fever or headache.  Patient not taking: Reported on 12/01/2023   [provider]  Active Self, Family Member           Med Note Ace Abu, [facility]   Mon Sep 20, 2023  3:58 PM) Prn   atorvastatin  (LIPITOR) 80 MG tablet [id number] Yes Take 1 tablet (80 mg total) by mouth daily. [name], [name], FNP Taking Active            Med Note Petrina Bras Nov 14, 2023 12:27 PM) Still taking 40 mg - has not picked up 80 mg tablets yet  Blood Glucose Monitoring Suppl DEVI [id number]  1 each by Does not apply route in the morning and at bedtime. May substitute to any manufacturer covered by patient's insurance. [name], [name], FNP  Active   glipiZIDE  (GLUCOTROL  XL) 5 MG 24 hr tablet [id number] Yes Take 1 tablet (5 mg total) by mouth daily with breakfast. [name], [name], FNP Taking Active            Med Note Adra Alanis   Sun Jan 02, 2024  1:14 PM) Taking twice daily  Glucose Blood (BLOOD GLUCOSE TEST STRIPS) STRP [id number]  1 each by Does not apply route 3 (three) times daily. Use as directed to check blood sugar. May dispense any manufacturer covered by patient's insurance and fits patient's device. Audria Leather, MD  Active   Glucose Blood (BLOOD GLUCOSE TEST STRIPS) STRP [id number]  1 each by In Vitro route 2 (two) times daily at 8 am and  10 pm. May substitute to any manufacturer covered by patient's insurance. [name], [name], FNP  Active   Insulin  Pen Needle (PEN NEEDLES) 31G X 5 MM MISC [id number]  1 each by Does not apply route 3 (three) times daily. May dispense any manufacturer covered by patient's insurance.  Patient not taking: Reported on 09/20/2023   Audria Leather, MD  Active   Lancet Device MISC [id number]  1 each by Does not apply route 3 (three) times daily. May dispense any manufacturer covered by patient's insurance. Audria Leather, MD  Active   Lancets MISC [id number]  1 each by Does not apply route 3 (three) times daily. Use as directed to check blood sugar. May dispense any manufacturer covered by patient's insurance and fits patient's device. Audria Leather, MD  Active   levothyroxine  (SYNTHROID ) 25 MCG tablet [id number] Yes Take 1 tablet (25 mcg  total) by mouth every morning. [name], [name], FNP Taking Active    Patient not taking:   Discontinued 01/02/24 1442 (Patient Preference)   metFORMIN  (GLUCOPHAGE -XR) 500 MG 24 hr tablet [id number] Yes Take 2 tablets (1,000 mg total) by mouth 2 (two) times daily with a meal. [name], [name], FNP Taking Active   PROLENSA 0.07 % SOLN [id number]  Place 1 drop into the right eye 2 (two) times daily. [provider]  Active               Assessment/Plan:   Diabetes: - Currently uncontrolled based on last A1c 9.2% above goal < 7% but improved from 11.5%. Agree with recent increase in metformin  to 1000 mg BID with stable eGFR > 60 mL/min. Discussed addition of Rybelsus (oral semaglutide) today, which would be estimated at $10.00 per mo with a coupon card. Patient and son reported that this would be affordable, however they would like to see the impact of higher dose metformin  first. Will follow-up via telephone next month to assess home BG and recommend addition of oral GLP-1RA if BG remain uncontrolled. Encouraged son to look into providing updated income  information to Medicaid.  - Reviewed long term cardiovascular and renal outcomes of uncontrolled blood sugar - Reviewed appropriate treatment of hypoglycemia - Reviewed goal A1c, goal fasting, and goal 2 hour post prandial glucose - Recommend to continue metformin  XR 1000 mg BID - Recommend to continue glipizide  XL 5 mg daily with breakfast - Recommend to check glucose fasting and 2 hr post prandial blood glucose daily - Can consider initiation of Rybelsus vs injectable GLP-1 RA at follow-up with appropriate administration counseling,and confirmation of  no hx of pancreatitis or family hx of MEN2, MTC. Patient reported that estimated copay of $10/mo for Rybelsus would be affordable (in the past has expressed that $35/mo foinsulin  is not affordable).  - Called pharmacy to discontinue old prescriptions and ensure their list is up to date. Patient and son aware of need to request refills from the pharmacy when they are running low on medications.   Hyperlipidemia/ASCVD Risk Reduction: - Currently uncontrolled with last LDL 91 mg/dL, above goal <96 mg/dL given DM. Patient likely will need addition of ezetimibe to reach goal, but will reassess after patient has consistently taken max dose metformin  for 6-12 weeks.  - Reviewed long term complications of uncontrolled cholesterol - Recommend to continue atorvastatin  80 mg daily - F/u lipid panel due ~July 2025  Medication Management - Previously recommended to start utilizing a pill box to help organize medications. Patient's son reported that patient did not feel he needed to use a pill box. Will continue to monitor for appropriate medication administration.  - Educated on looking into lapse in [place] and whether he is eligible for renewal.  - Contacted pharmacy to discontinue old prescriptions given recent changes in doses/instructions for metformin , glipizide , and atorvatatin.   Follow Up Plan: PCP 03/08/24, Pharmacist telephone 01/30/24  Arthea Larsson, PharmD PGY1 Pharmacy Resident

## 2024-01-03 MED ORDER — METFORMIN HCL ER 500 MG PO TB24: 1000.0000 mg | ORAL_TABLET | Freq: Two times a day (BID) | ORAL | 3 refills | Status: DC

## 2024-01-30 ENCOUNTER — Other Ambulatory Visit: Payer: Self-pay

## 2024-01-30 NOTE — Progress Notes (Signed)
 Attempted to contact patient for scheduled appointment for medication management. Unable to complete the telephone visit as I was unable to connect to a Mariah Shines interpreter through [employer]. Will contact [facility] care guide to attempt to reschedule.  Georga Killings, PharmD PGY-1 Pharmacy Resident

## 2024-03-03 ENCOUNTER — Telehealth: Payer: Self-pay | Admitting: *Deleted

## 2024-03-03 NOTE — Progress Notes (Signed)
 Complex Care Management Care Guide Note  03/03/2024 Name: Rayvon Dakin MRN: [id number] DOB: July 10, 1956  Nazim Kadlec is a 68 y.o. year old male who is a primary care patient of [name], [name], FNP and is actively engaged with the care management team. I reached out to [facility] by phone today to assist with re-scheduling  with the Pharmacist.  Follow up plan: Unsuccessful telephone outreach attempt made.  Harlene Satterfield  [facility]  [facility], [facility] Guide  Direct Dial : [phone]  Fax [phone]

## 2024-03-06 ENCOUNTER — Ambulatory Visit: Payer: Self-pay | Admitting: Nurse Practitioner

## 2024-03-08 ENCOUNTER — Encounter: Payer: Self-pay | Admitting: Nurse Practitioner

## 2024-03-08 ENCOUNTER — Ambulatory Visit (INDEPENDENT_AMBULATORY_CARE_PROVIDER_SITE_OTHER): Payer: Self-pay | Admitting: Nurse Practitioner

## 2024-03-08 VITALS — BP 132/79 | HR 70 | Temp 97.2°F | Wt 153.0 lb

## 2024-03-08 DIAGNOSIS — E785 Hyperlipidemia, unspecified: Secondary | ICD-10-CM | POA: Diagnosis not present

## 2024-03-08 DIAGNOSIS — E039 Hypothyroidism, unspecified: Secondary | ICD-10-CM | POA: Diagnosis not present

## 2024-03-08 DIAGNOSIS — E1165 Type 2 diabetes mellitus with hyperglycemia: Secondary | ICD-10-CM

## 2024-03-08 DIAGNOSIS — R030 Elevated blood-pressure reading, without diagnosis of hypertension: Secondary | ICD-10-CM | POA: Diagnosis not present

## 2024-03-08 LAB — POCT GLYCOSYLATED HEMOGLOBIN (HGB A1C): Hemoglobin A1C: 9.4 % — AB (ref 4.0–5.6)

## 2024-03-08 MED ORDER — METFORMIN HCL ER 500 MG PO TB24: 1000.0000 mg | ORAL_TABLET | Freq: Two times a day (BID) | ORAL | 3 refills | Status: DC

## 2024-03-08 MED ORDER — ATORVASTATIN CALCIUM 80 MG PO TABS: 80.0000 mg | ORAL_TABLET | Freq: Every day | ORAL | 3 refills | Status: DC

## 2024-03-08 MED ORDER — LEVOTHYROXINE SODIUM 25 MCG PO TABS: 25.0000 ug | ORAL_TABLET | Freq: Every morning | ORAL | 1 refills | Status: DC

## 2024-03-08 NOTE — Assessment & Plan Note (Signed)
 Lab Results  Component Value Date   CHOL 97 05/17/2023   HDL 23 (L) 05/17/2023   LDLCALC 49 05/17/2023   LDLDIRECT 91 08/23/2023   TRIG 123 05/17/2023   CHOLHDL 4.2 05/17/2023  Checking lipid panel LDL goal is less than 70

## 2024-03-08 NOTE — Progress Notes (Signed)
 Established Patient Office Visit  Subjective:  Patient ID: Nicholas Johnston, male    DOB: 1955-10-28  Age: 68 y.o. MRN: [id number]  CC:  Chief Complaint  Patient presents with   Diabetes    HPI Nicholas Johnston is a 68 y.o. male  has a past medical history of Acute pyelonephritis (05/17/2023), Diabetes mellitus without complication (HCC), Dyslipidemia, goal LDL below 70 (05/26/2023), Hyperlipidemia, Insomnia (05/26/2023), and Uncontrolled type 2 diabetes mellitus with hyperglycemia (HCC) (05/26/2023).  Patient presents for follow-up for his chronic medical conditions Interpretation services provided by medical interpreter He is accompanied by his son  Uncontrolled type 2 diabetes.  Currently on metformin  1000 mg twice daily, not sure if he is taking glipizide .  Stated that his blood sugar readings has been between 85-1 80s, he has been following a low-carb diet, does a lot of walking at work, used to do walking exercises before but not since he started working.  Takes atorvastatin  80 mg daily for hyperlipidemia.  He is not interested in an injectable medication due to  impaired vision.        Past Medical History:  Diagnosis Date   Acute pyelonephritis 05/17/2023   Diabetes mellitus without complication (HCC)    Dyslipidemia, goal LDL below 70 05/26/2023   Hyperlipidemia    Insomnia 05/26/2023   Uncontrolled type 2 diabetes mellitus with hyperglycemia (HCC) 05/26/2023    History reviewed. No pertinent surgical history.  Family History  Family history unknown: Yes    Social History   Socioeconomic History   Marital status: Married    Spouse name: Sa Nay   Number of children: 3   Years of education: Not on file   Highest education level: Not on file  Occupational History   Not on file  Tobacco Use   Smoking status: Never   Smokeless tobacco: Never  Vaping Use   Vaping status: Never Used  Substance and Sexual Activity   Alcohol use: Not Currently    Comment: occ   Drug use:  No   Sexual activity: Not Currently  Other Topics Concern   Not on file  Social History Narrative   Lives with his family    Social Drivers of Corporate investment banker Strain: Not on file  Food Insecurity: No Food Insecurity (05/24/2023)   Hunger Vital Sign    Worried About Running Out of Food in the Last Year: Never true    Ran Out of Food in the Last Year: Never true  Transportation Needs: No Transportation Needs (05/24/2023)   PRAPARE - Administrator, Civil Service (Medical): No    Lack of Transportation (Non-Medical): No  Physical Activity: Not on file  Stress: Not on file  Social Connections: Not on file  Intimate Partner Violence: Not At Risk (05/17/2023)   Humiliation, Afraid, Rape, and Kick questionnaire    Fear of Current or Ex-Partner: No    Emotionally Abused: No    Physically Abused: No    Sexually Abused: No    Outpatient Medications Prior to Visit  Medication Sig Dispense Refill   Blood Glucose Monitoring Suppl DEVI 1 each by Does not apply route in the morning and at bedtime. May substitute to any manufacturer covered by patient's insurance. 1 each 0   glipiZIDE  (GLUCOTROL  XL) 5 MG 24 hr tablet Take 1 tablet (5 mg total) by mouth daily with breakfast. 90 tablet 1   Glucose Blood (BLOOD GLUCOSE TEST STRIPS) STRP 1 each by Does not apply  route 3 (three) times daily. Use as directed to check blood sugar. May dispense any manufacturer covered by patient's insurance and fits patient's device. 100 strip 0   Glucose Blood (BLOOD GLUCOSE TEST STRIPS) STRP 1 each by In Vitro route 2 (two) times daily at 8 am and 10 pm. May substitute to any manufacturer covered by patient's insurance. 200 strip 4   Lancet Device MISC 1 each by Does not apply route 3 (three) times daily. May dispense any manufacturer covered by patient's insurance. 1 each 0   atorvastatin  (LIPITOR) 80 MG tablet Take 1 tablet (80 mg total) by mouth daily. 90 tablet 3   levothyroxine  (SYNTHROID ) 25  MCG tablet Take 1 tablet (25 mcg total) by mouth every morning. 90 tablet 1   metFORMIN  (GLUCOPHAGE -XR) 500 MG 24 hr tablet Take 2 tablets (1,000 mg total) by mouth 2 (two) times daily with a meal. 360 tablet 3   acetaminophen  (TYLENOL ) 500 MG tablet Take 1,000 mg by mouth 2 (two) times daily as needed for moderate pain, fever or headache. (Patient not taking: Reported on 03/08/2024)     Insulin  Pen Needle (PEN NEEDLES) 31G X 5 MM MISC 1 each by Does not apply route 3 (three) times daily. May dispense any manufacturer covered by patient's insurance. (Patient not taking: Reported on 09/20/2023) 100 each 0   Lancets MISC 1 each by Does not apply route 3 (three) times daily. Use as directed to check blood sugar. May dispense any manufacturer covered by patient's insurance and fits patient's device. 100 each 0   PROLENSA 0.07 % SOLN Place 1 drop into the right eye 2 (two) times daily. (Patient not taking: Reported on 03/08/2024)     No facility-administered medications prior to visit.    No Known Allergies  ROS Review of Systems  Constitutional:  Negative for appetite change, chills, fatigue and fever.  HENT:  Negative for congestion, postnasal drip, rhinorrhea and sneezing.   Respiratory:  Negative for cough, shortness of breath and wheezing.   Cardiovascular:  Negative for chest pain, palpitations and leg swelling.  Gastrointestinal:  Negative for abdominal pain, constipation, nausea and vomiting.  Genitourinary:  Negative for difficulty urinating, dysuria, flank pain and frequency.  Musculoskeletal:  Negative for arthralgias, back pain, joint swelling and myalgias.  Skin:  Negative for color change, pallor, rash and wound.  Neurological:  Negative for dizziness, facial asymmetry, weakness, numbness and headaches.  Psychiatric/Behavioral:  Negative for behavioral problems, confusion, self-injury and suicidal ideas.       Objective:    Physical Exam Vitals and nursing note reviewed.   Constitutional:      General: He is not in acute distress.    Appearance: Normal appearance. He is not ill-appearing, toxic-appearing or diaphoretic.  HENT:     Mouth/Throat:     Pharynx: No oropharyngeal exudate.  Eyes:     General: No scleral icterus.       Right eye: No discharge.        Left eye: No discharge.     Extraocular Movements: Extraocular movements intact.     Conjunctiva/sclera: Conjunctivae normal.  Cardiovascular:     Rate and Rhythm: Normal rate and regular rhythm.     Pulses: Normal pulses.     Heart sounds: Normal heart sounds. No murmur heard.    No friction rub. No gallop.  Pulmonary:     Effort: Pulmonary effort is normal. No respiratory distress.     Breath sounds: Normal breath sounds. No stridor.  No wheezing, rhonchi or rales.  Chest:     Chest wall: No tenderness.  Abdominal:     General: There is no distension.     Palpations: Abdomen is soft.     Tenderness: There is no abdominal tenderness. There is no right CVA tenderness, left CVA tenderness or guarding.  Musculoskeletal:        General: No swelling, tenderness, deformity or signs of injury.     Right lower leg: No edema.     Left lower leg: No edema.  Skin:    General: Skin is warm and dry.     Capillary Refill: Capillary refill takes less than 2 seconds.     Coloration: Skin is not jaundiced or pale.     Findings: No bruising, erythema or lesion.  Neurological:     Mental Status: He is alert and oriented to person, place, and time.     Motor: No weakness.     Gait: Gait normal.  Psychiatric:        Mood and Affect: Mood normal.        Behavior: Behavior normal.        Thought Content: Thought content normal.        Judgment: Judgment normal.     BP 132/79   Pulse 70   Temp (!) 97.2 F (36.2 C)   Wt 153 lb (69.4 kg)   SpO2 99%   BMI 25.46 kg/m  Wt Readings from Last 3 Encounters:  03/08/24 153 lb (69.4 kg)  12/01/23 155 lb (70.3 kg)  10/19/23 153 lb 9.6 oz (69.7 kg)     Lab Results  Component Value Date   TSH 2.260 12/01/2023   Lab Results  Component Value Date   WBC 6.2 08/23/2023   HGB 13.5 08/23/2023   HCT 41.8 08/23/2023   MCV 87 08/23/2023   PLT 203 08/23/2023   Lab Results  Component Value Date   NA 140 12/01/2023   K 4.8 12/01/2023   CO2 26 12/01/2023   GLUCOSE 168 (H) 12/01/2023   BUN 19 12/01/2023   CREATININE 1.09 12/01/2023   BILITOT 0.7 05/17/2023   ALKPHOS 84 05/17/2023   AST 15 05/17/2023   ALT 28 05/17/2023   PROT 7.0 05/17/2023   ALBUMIN 3.0 (L) 05/17/2023   CALCIUM  9.4 12/01/2023   ANIONGAP 12 05/20/2023   EGFR 75 12/01/2023   Lab Results  Component Value Date   CHOL 97 05/17/2023   Lab Results  Component Value Date   HDL 23 (L) 05/17/2023   Lab Results  Component Value Date   LDLCALC 49 05/17/2023   Lab Results  Component Value Date   TRIG 123 05/17/2023   Lab Results  Component Value Date   CHOLHDL 4.2 05/17/2023   Lab Results  Component Value Date   HGBA1C 9.4 (A) 03/08/2024      Assessment & Plan:   Problem List Items Addressed This Visit       Endocrine   Uncontrolled type 2 diabetes mellitus with hyperglycemia (HCC) - Primary   Lab Results  Component Value Date   HGBA1C 9.4 (A) 03/08/2024  Chronic medical condition currently uncontrolled Patient encouraged to bring all his medications with him to his appointments, he will bring his medications to the office this week for medication review to determine appropriate medication change Patient referred for diabetes nutrition education Counseled on low-carb diet, encouraged to engage in regular moderate exercise at least  150 minutes weekly as tolerated Up-to-date with  diabetic eye exam, next appointment is in December 2025, records requested Appreciate collaboration with the clinical pharmacist       Relevant Medications   metFORMIN  (GLUCOPHAGE -XR) 500 MG 24 hr tablet   atorvastatin  (LIPITOR) 80 MG tablet   Other Relevant Orders    POCT glycosylated hemoglobin (Hb A1C) (Completed)   Microalbumin/Creatinine Ratio, Urine   CMP14+EGFR   Lipid panel   Amb Referral to Nutrition and Diabetic Education   Recheck vitals   Hypothyroidism   Continue levothyroxine  25 mcg daily Lab Results  Component Value Date   TSH 2.260 12/01/2023         Relevant Medications   levothyroxine  (SYNTHROID ) 25 MCG tablet     Other   Dyslipidemia, goal LDL below 70   Lab Results  Component Value Date   CHOL 97 05/17/2023   HDL 23 (L) 05/17/2023   LDLCALC 49 05/17/2023   LDLDIRECT 91 08/23/2023   TRIG 123 05/17/2023   CHOLHDL 4.2 05/17/2023  Checking lipid panel LDL goal is less than 70      Relevant Medications   atorvastatin  (LIPITOR) 80 MG tablet   Elevated BP without diagnosis of hypertension   Systolic blood pressure is elevated but has been previously well-controlled Not on blood pressure medications Will continue to monitor and start on a medication if blood pressure remains elevated      03/08/2024    9:10 AM 03/08/2024    8:13 AM 12/01/2023    8:11 AM 10/19/2023    3:28 PM 09/20/2023    3:52 PM 08/23/2023    3:22 PM 06/23/2023    3:37 PM  BP/Weight  Systolic BP 132 131 111 126 95 117 116  Diastolic BP 79 70 79 85 52 76 79  Wt. (Lbs)  153 155 153.6 152 151 141  BMI  25.46 kg/m2 25.79 kg/m2 25.56 kg/m2 25.29 kg/m2 25.13 kg/m2 23.46 kg/m2            Meds ordered this encounter  Medications   metFORMIN  (GLUCOPHAGE -XR) 500 MG 24 hr tablet    Sig: Take 2 tablets (1,000 mg total) by mouth 2 (two) times daily with a meal.    Dispense:  360 tablet    Refill:  3    Dose increase. Discontinue Rx for metformin  500 mg BID.   atorvastatin  (LIPITOR) 80 MG tablet    Sig: Take 1 tablet (80 mg total) by mouth daily.    Dispense:  90 tablet    Refill:  3   levothyroxine  (SYNTHROID ) 25 MCG tablet    Sig: Take 1 tablet (25 mcg total) by mouth every morning.    Dispense:  90 tablet    Refill:  1    Follow-up: Return in  about 3 months (around 06/08/2024) for DM.    Nate Common R Theodoros Stjames, FNP

## 2024-03-08 NOTE — Patient Instructions (Addendum)
 Please cal the nutritionist to schedule an appointment for diabetes nutrition education . [phone]   [address]  Goal for fasting blood sugar ranges from 80 to 120 and 2 hours after any meal or at bedtime should be between 130 to 170.   Carry rapidly absorbed carbohydrate source at all times Treats low glucose less than 70 as per rule of 15's.  Give 15 g of rapidly absorbed carbohydrate i.e. half cup juice or 4 glucose tabs.  Recheck glucose level in 15 minutes.  Give another 15 g of carbohydrate if glucose still less than 70.  Repeat until glucose level is greater than 70.  Once glucose level returns to normal consider follow-up with a snack or meal.    It is important that you exercise regularly at least 30 minutes 5 times a week as tolerated  Think about what you will eat, plan ahead. Choose  clean, green, fresh or frozen over canned, processed or packaged foods which are more sugary, salty and fatty. 70 to 75% of food eaten should be vegetables and fruit. Three meals at set times with snacks allowed between meals, but they must be fruit or vegetables. Aim to eat over a 12 hour period , example 7 am to 7 pm, and STOP after  your last meal of the day. Drink water,generally about 64 ounces per day, no other drink is as healthy. Fruit juice is best enjoyed in a healthy way, by EATING the fruit.  Thanks for choosing [facility] we consider it a privelige to serve you.

## 2024-03-08 NOTE — Assessment & Plan Note (Signed)
 Systolic blood pressure is elevated but has been previously well-controlled Not on blood pressure medications Will continue to monitor and start on a medication if blood pressure remains elevated      03/08/2024    9:10 AM 03/08/2024    8:13 AM 12/01/2023    8:11 AM 10/19/2023    3:28 PM 09/20/2023    3:52 PM 08/23/2023    3:22 PM 06/23/2023    3:37 PM  BP/Weight  Systolic BP 132 131 111 126 95 117 116  Diastolic BP 79 70 79 85 52 76 79  Wt. (Lbs)  153 155 153.6 152 151 141  BMI  25.46 kg/m2 25.79 kg/m2 25.56 kg/m2 25.29 kg/m2 25.13 kg/m2 23.46 kg/m2

## 2024-03-08 NOTE — Assessment & Plan Note (Signed)
 Continue levothyroxine  25 mcg daily Lab Results  Component Value Date   TSH 2.260 12/01/2023

## 2024-03-08 NOTE — Assessment & Plan Note (Addendum)
 Lab Results  Component Value Date   HGBA1C 9.4 (A) 03/08/2024  Chronic medical condition currently uncontrolled Patient encouraged to bring all his medications with him to his appointments, he will bring his medications to the office this week for medication review to determine appropriate medication change Patient referred for diabetes nutrition education Counseled on low-carb diet, encouraged to engage in regular moderate exercise at least  150 minutes weekly as tolerated Up-to-date with diabetic eye exam, next appointment is in December 2025, records requested Appreciate collaboration with the clinical pharmacist

## 2024-03-09 LAB — LIPID PANEL
Chol/HDL Ratio: 3.3 ratio (ref 0.0–5.0)
Cholesterol, Total: 124 mg/dL (ref 100–199)
HDL: 38 mg/dL — ABNORMAL LOW (ref >39)
LDL Chol Calc (NIH): 61 mg/dL (ref 0–99)
Triglycerides: 143 mg/dL (ref 0–149)
VLDL Cholesterol Cal: 25 mg/dL (ref 5–40)

## 2024-03-09 LAB — CMP14+EGFR
ALT: 31 IU/L (ref 0–44)
AST: 26 IU/L (ref 0–40)
Albumin: 4.3 g/dL (ref 3.9–4.9)
Alkaline Phosphatase: 100 IU/L (ref 44–121)
BUN/Creatinine Ratio: 20 (ref 10–24)
BUN: 22 mg/dL (ref 8–27)
Bilirubin Total: 0.5 mg/dL (ref 0.0–1.2)
CO2: 25 mmol/L (ref 20–29)
Calcium: 9.7 mg/dL (ref 8.6–10.2)
Chloride: 102 mmol/L (ref 96–106)
Creatinine, Ser: 1.12 mg/dL (ref 0.76–1.27)
Globulin, Total: 2.9 g/dL (ref 1.5–4.5)
Glucose: 184 mg/dL — ABNORMAL HIGH (ref 70–99)
Potassium: 5.2 mmol/L (ref 3.5–5.2)
Sodium: 139 mmol/L (ref 134–144)
Total Protein: 7.2 g/dL (ref 6.0–8.5)
eGFR: 73 mL/min/1.73 (ref >59)

## 2024-03-09 LAB — MICROALBUMIN / CREATININE URINE RATIO
Creatinine, Urine: 83.8 mg/dL
Microalb/Creat Ratio: 13 mg/g{creat} (ref 0–29)
Microalbumin, Urine: 11.1 ug/mL

## 2024-03-10 ENCOUNTER — Ambulatory Visit: Payer: Self-pay

## 2024-03-10 ENCOUNTER — Ambulatory Visit: Payer: Self-pay | Admitting: Nurse Practitioner

## 2024-03-13 NOTE — Progress Notes (Unsigned)
 Complex Care Management Care Guide Note  03/13/2024 Name: Nicholas Johnston MRN: [id number] DOB: Oct 05, 1955  Nicholas Johnston is a 68 y.o. year old male who is a primary care patient of [name], [name], FNP and is actively engaged with the care management team. I reached out to [facility] by phone today to assist with scheduling  with the Pharmacist.  Follow up plan: Unsuccessful telephone outreach attempt made.   Harlene Satterfield  [facility]  [facility], [facility] Guide  Direct Dial : [phone]  Fax [phone]

## 2024-03-14 NOTE — Progress Notes (Signed)
 Complex Care Management Care Guide Note  03/14/2024 Name: Nicholas Johnston MRN: [id number] DOB: 1956-06-13  Baine Decesare is a 68 y.o. year old male who is a primary care patient of [name], [name], FNP and is actively engaged with the care management team. I reached out to [facility] by phone today to assist with re-scheduling  with the Pharmacist.  Follow up plan: Face to Face appointment with complex care management team member scheduled for:  8/6  Harlene Satterfield  [facility]  [facility], [facility] Guide  Direct Dial : [phone]  Fax [phone]

## 2024-03-22 ENCOUNTER — Ambulatory Visit: Payer: Self-pay

## 2024-03-22 ENCOUNTER — Telehealth: Payer: Self-pay

## 2024-03-22 NOTE — Telephone Encounter (Signed)
 Attempted to contact patient for scheduled appointment in person at [facility] for medication management. Left HIPAA compliant message for patient to return my call at their convenience.   Lorain Baseman, PharmD [facility] [phone]

## 2024-03-22 NOTE — Progress Notes (Deleted)
 03/22/2024 Name: Nicholas Johnston MRN: [id number] DOB: 1955-10-30  No chief complaint on file.   Nicholas Johnston is a 68 y.o. year old male who presented for a telephone visit. PMH includes T2DM, HLD, vision impairment.    They were referred to the pharmacist by their PCP for assistance in managing diabetes. Visit conducted with assistance from [facility] interpreter [phone].  Subjective: Patient was last seen by PCP, Nicholas Shall, NP on 03/08/24. Patient reported taking metformin  as prescribed, but was unclear if he was taking glipizide . He reported home BG ranging 85-180s. He endorsed following a low-carb diet. He was counseled to bring all his medications to his upcoming appointment with pharmacy.  Patient reports ***  Patient reports doing well today. He is aware of the improvement in his A1C. He confirms that he was told to increase metformin  to 2 tablets twice daily (1000 mg BID). Patient and son confirm that he has current supplies of all maintenance medications. His son expresses some concern that the medication labels they have from the pharmacy differ from most recently provided instructions. Appears that pharmacy has been filling older prescriptions for glipizide  and metformin  and atorvastatin .  Care Team: Primary Care Provider: [name], [name], FNP ; Next Scheduled Visit: 06/09/24  Medication Access/Adherence  Current Pharmacy:  [id number] [address], [place] - [address] AT [facility] [address] & [facility] [address] [place] [address] Phone: [phone] Fax: [phone]  [place] [facility] [address] [place] [address] Phone: [phone] Fax: [phone]  Patient reports affordability concerns with their medications: No  Patient reports access/transportation concerns to their pharmacy: No  Patient reports adherence concerns with their medications:  Yes  - still taking glipizide  5 mg twice daily, instead of once daily  because this is what the pharmacy recently dispensed. Still taking atorvastatin  40 mg instead of 80 mg, because this is what the pharmacy dispensed.   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 He previously was enrolled in [facility] (Oct 2024). Patient's son reports that they did receive some Medicaid forms in the mail, asking for updated income information to determine whether he is eligible for continued coverage. They have not mailed these forms back yet.   Diabetes:  Current medications: metformin  XR 1000 mg BID, glipizide  XL 5 mg daily (taking twice daily) Medications tried in the past: Semglee , Novolog  (stopped d/t pt inability to self inject insulin  correctly due to impaired vision, patient also noted he was unable to afford $35 copay)glipizide  IR (hypoglycemia)  Glucometer available for review today. Checking daily fasting BG and occasional PPG.  12/27/23: 200  12/28/23: 118, 205  12/29/23: 209  12/30/23: 202, 96 12/31/23: 178 01/01/24 155, 184 01/02/24: 129   Patient reports that he has not felt symptoms of hypoglycemia since April. Had an episode of palpitations and dizziness - once he got home, BG was found to be < 70 mg/dL. This has not happened in a couple weeks.   Patient denies hyperglycemic symptoms including polyuria, nocturia, neuropathy, blurred vision.   Current meal patterns: Son reports that he is avoiding sugary foods and drinks, but he does eat rice everyday and this is difficult for him to cut back on.  - Occasional morning snacks: muffin, coffee  11 AM and 1 PM meals - 11:00 AM: bread, green tea, low sugar beverage - 1:00 PM: hamburger, chicken wings, rice and curry  - Drinks:  sometimes soda, but never drinks a whole bottle, 5 bottles of water per day   Hyperlipidemia/ASCVD Risk Reduction  Current lipid lowering medications: atorvastatin  80 mg daily (did take 80 mg x1 mo, but pharmacy most recently dispensed 40 mg  tablets)  Informed patent/son that atorvastatin  80 mg appeared to be ready for pickup at [facility]. Instructed son to dispose of atorvastatin  40 mg tablets to reduce confusion.   ASCVD History: none Family History: T2DM, HLD   Objective:  Lab Results  Component Value Date   HGBA1C 9.4 (A) 03/08/2024    Lab Results  Component Value Date   CREATININE 1.12 03/08/2024   BUN 22 03/08/2024   NA 139 03/08/2024   K 5.2 03/08/2024   CL 102 03/08/2024   CO2 25 03/08/2024    Lab Results  Component Value Date   CHOL 124 03/08/2024   HDL 38 (L) 03/08/2024   LDLCALC 61 03/08/2024   LDLDIRECT 91 08/23/2023   TRIG 143 03/08/2024   CHOLHDL 3.3 03/08/2024    Medications Reviewed Today   Medications were not reviewed in this encounter       Assessment/Plan:   Diabetes: - Currently uncontrolled based on last A1c 9.2% above goal < 7% but improved from 11.5%. Agree with recent increase in metformin  to 1000 mg BID with stable eGFR > 60 mL/min. Discussed addition of Rybelsus (oral semaglutide) today, which would be estimated at $10.00 per mo with a coupon card. Patient and son reported that this would be affordable, however they would like to see the impact of higher dose metformin  first. Will follow-up via telephone next month to assess home BG and recommend addition of oral GLP-1RA if BG remain uncontrolled. Encouraged son to look into providing updated income information to Medicaid.  - Reviewed long term cardiovascular and renal outcomes of uncontrolled blood sugar - Reviewed appropriate treatment of hypoglycemia - Reviewed goal A1c, goal fasting, and goal 2 hour post prandial glucose - Recommend to continue metformin  XR 1000 mg BID - Recommend to continue glipizide  XL 5 mg daily with breakfast - Recommend to check glucose fasting and 2 hr post prandial blood glucose daily - Can consider initiation of Rybelsus vs injectable GLP-1 RA at follow-up with appropriate administration  counseling,and confirmation of  no hx of pancreatitis or family hx of MEN2, MTC. Patient reported that estimated copay of $10/mo for Rybelsus would be affordable (in the past has expressed that $35/mo foinsulin  is not affordable).  - Called pharmacy to discontinue old prescriptions and ensure their list is up to date. Patient and son aware of need to request refills from the pharmacy when they are running low on medications.   Hyperlipidemia/ASCVD Risk Reduction: - Currently uncontrolled with last LDL 91 mg/dL, above goal <29 mg/dL given DM. Patient likely will need addition of ezetimibe to reach goal, but will reassess after patient has consistently taken max dose atorvastatin  for 6-12 weeks.  - Reviewed long term complications of uncontrolled cholesterol - Recommend to continue atorvastatin  80 mg daily - F/u lipid panel due ~July 2025  Medication Management - Previously recommended to start utilizing a pill box to help organize medications. Patient's son reported that patient did not feel he needed to use a pill box. Will continue to monitor for appropriate medication administration.  - Educated on looking into lapse in [place] and whether he is eligible for renewal.  - Contacted pharmacy to discontinue old prescriptions given recent changes in doses/instructions for metformin , glipizide , and atorvatatin.  Follow Up Plan: PCP 03/08/24, Pharmacist telephone 01/30/24  Lorain Baseman, PharmD [facility] [phone]

## 2024-04-12 ENCOUNTER — Encounter: Payer: Self-pay | Admitting: Nurse Practitioner

## 2024-04-12 ENCOUNTER — Ambulatory Visit (INDEPENDENT_AMBULATORY_CARE_PROVIDER_SITE_OTHER): Payer: Self-pay

## 2024-04-12 ENCOUNTER — Other Ambulatory Visit (HOSPITAL_COMMUNITY): Payer: Self-pay

## 2024-04-12 DIAGNOSIS — E1165 Type 2 diabetes mellitus with hyperglycemia: Secondary | ICD-10-CM | POA: Diagnosis not present

## 2024-04-12 NOTE — Progress Notes (Signed)
 04/12/2024 Name: Nicholas Johnston MRN: [id number] DOB: [birth year]  Chief Complaint  Patient presents with   Diabetes   Hyperlipidemia    Nicholas Johnston is a 68 y.o. year old male who presented for a face-to-face visit. PMH includes T2DM, HLD, vision impairment.    They were referred to the pharmacist by their PCP for assistance in managing diabetes. Visit conducted with assistance from [facility] interpreters.  Subjective: Patient was last seen by PCP, [name], NP on 03/08/24. A1C was stable at 9.4% (previously 9.2%). Patient reported taking metformin  as prescribed, but was unclear if he was taking glipizide . He reported home BG ranging 85-180s. He endorsed following a low-carb diet. He was counseled to bring all his medications to his upcoming appointment with pharmacy.  Patient reports doing ok. He is accompanied by his son Nicholas Johnston today. He brought empty medication bottles with him today - reports that he ran out of a couple days ago. He has bottles of metformin , levothyroxine , and atorvastatin . He does not have a bottle of glipizide . He reports that he thinks he still has Medicaid, but he reports that all his medications cost ~$70 for a one month supply.   Care Team: Primary Care Provider: [name], [name], FNP ; Next Scheduled Visit: 06/09/24  Medication Access/Adherence  Current Pharmacy:  [id number] [address], [place] - [address] AT [facility] [address] & [facility] [address] [place] [address] Phone: [phone] Fax: [phone]  [place] [facility] [address] [place] [address] Phone: [phone] Fax: [phone]  Patient reports affordability concerns with their medications: No  Patient reports access/transportation concerns to their pharmacy: No  Patient reports adherence concerns with their medications:  Yes  - brought empty medication bottles with him today. No bottle for glipizide .  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 He previously was enrolled in [facility] (Oct 2024) - but this does not appear to active. Provided patient's pharmacy with his [facility] info from [facility] and they stated that they could not run as secondary or primary. Our [employer] are not contracted with his primary insurance, so I am unable to run test claims.  Previously, patient's son reports that they did receive some Medicaid forms in the mail, asking for updated income information to determine whether he is eligible for continued coverage but I do not believe that they ever reached out.   Called [facility] - reported they had atorvastatin  and metformin  ready to pick up. Once they ran Rx for glipizide  and levothyroxine  - total cost (for $30 supplies) went up to $76.72.  Diabetes:  Current medications: metformin  XR 1000 mg BID, glipizide  XL 5 mg daily (ran out) Medications tried in the past: Semglee , Novolog  (stopped d/t pt inability to self inject insulin  correctly due to impaired vision, patient also noted he was unable to afford $35 copay)glipizide  IR (hypoglycemia), patient thinks he may have taken Trulicity  before in [place] .   Glucometer available for review today. Checking daily fasting BG and occasional PPG. - reports that BG are usually 180-200 mg/dL. Sometimes checks before meals, sometimes checks 2 hour after meals. Does not have glucometer with him today to review.   Patient reports that he has not felt symptoms of hypoglycemia since April. Had an episode of palpitations and dizziness - once he got home, BG was found to be < 70 mg/dL. This has not happened in a couple  weeks.   Patient denies hyperglycemic symptoms including polyuria, nocturia, neuropathy, blurred vision.   Current meal patterns: Son reports that he is avoiding sugary foods and drinks, but he does eat rice everyday and this is difficult for him to cut back on.  - Occasional morning snacks:  muffin, coffee  11 AM and 1 PM meals - 11:00 AM: bread, green tea, low sugar beverage - 1:00 PM: hamburger, chicken wings, rice and curry  - Drinks: sometimes diet or zero soda, but never drinks a whole bottle, 4-5 bottles of water per day   Hyperlipidemia/ASCVD Risk Reduction  Current lipid lowering medications: atorvastatin  80 mg daily (recently ran out)  ASCVD History: none Family History: T2DM, HLD   Objective:  BP Readings from Last 3 Encounters:  03/08/24 132/79  12/01/23 111/79  10/19/23 126/85    Lab Results  Component Value Date   HGBA1C 9.4 (A) 03/08/2024   HGBA1C 9.2 (A) 12/01/2023   HGBA1C 11.5 (A) 08/23/2023    Lab Results  Component Value Date   CREATININE 1.12 03/08/2024   BUN 22 03/08/2024   NA 139 03/08/2024   K 5.2 03/08/2024   CL 102 03/08/2024   CO2 25 03/08/2024    Lab Results  Component Value Date   CHOL 124 03/08/2024   HDL 38 (L) 03/08/2024   LDLCALC 61 03/08/2024   LDLDIRECT 91 08/23/2023   TRIG 143 03/08/2024   CHOLHDL 3.3 03/08/2024    Medications Reviewed Today   Medications were not reviewed in this encounter       Assessment/Plan:   Diabetes: - Currently uncontrolled based on last A1c 9.4% above goal < 7% stable from 9.2% previously. Escalation in pharmacotherapy is currently limited by cost. Patient's Medicaid does not appear to be active and copays for generic medications through is commercial insurance are higher than expected. Could potentially reduce cost by transitioning patient from metformin  and glipizide  to Xigduo XR which has a $0 copay card, though unsure whether this is on his insurance formulary. Have also discussed addition of either Rybelsus or Trulicity  with patient, but need to further investigate coverage and cost first.  - Reviewed long term cardiovascular and renal outcomes of uncontrolled blood sugar - Reviewed appropriate treatment of hypoglycemia - Reviewed goal A1c, goal fasting, and goal 2 hour  post prandial glucose - Recommend to continue metformin  XR 1000 mg BID - Recommend to continue glipizide  XL 5 mg daily with breakfast - Will call patient's Medicaid plan to confirm that coverage has been terminated. If co-pays through primary insurance are unaffordable, will work with [facility] pharmacy to determine whether discount pricing is feasible for the patient.  - Will collaborate with CPhT to submit PA for Trulicity  and Xigduo to investigate coverage  - Recommend to check glucose fasting and 2 hr post prandial blood glucose daily - Confirmed no hx of pancreatitis or family hx of MEN2, MTC.  - Called pharmacy to request fills of all four of his maintenance medications   Hyperlipidemia/ASCVD Risk Reduction: - Currently controlled with last LDL 61 mg/dL, below goal <29 mg/dL given DM on high intensity statin  - Reviewed long term complications of uncontrolled cholesterol - Recommend to continue atorvastatin  80 mg daily   Will contact patient's Medicaid plan to investigate current coverage and collaborate with [facility] pharmacy team to determine whether discount medication pricing would be more affordable for the patient.   Follow Up Plan: PCP 06/09/24, Pharmacist telephone 05/23/24 (will also follow-up later this week about  medication costs/plan) Lorain Baseman, PharmD [facility] [phone]

## 2024-04-12 NOTE — Patient Instructions (Addendum)
 It was nice to see you today!  Your goal blood sugar is 80-130 before eating and less than 180 after eating. Please bring your meter and log to upcoming appointments.  Medication Changes: Pick up your medications from the pharmacy. The list at the end of this paper are your accurate medications  I will call you to confirm that your insurance covers Trulicity . If approved, START Trulicity  0.75 mg injected into the skin ONCE WEEKLY.   Monitor blood sugars at home and keep a log (glucometer or piece of paper) to bring with you to your next visit.  Keep up the good work with diet and exercise. Aim for a diet full of vegetables, fruit and lean meats (chicken, [place], fish). Try to limit salt intake by eating fresh or frozen vegetables (instead of canned), rinse canned vegetables prior to cooking and do not add any additional salt to meals.

## 2024-04-13 ENCOUNTER — Telehealth: Payer: Self-pay

## 2024-04-13 ENCOUNTER — Other Ambulatory Visit (HOSPITAL_COMMUNITY): Payer: Self-pay

## 2024-04-13 ENCOUNTER — Other Ambulatory Visit: Payer: Self-pay

## 2024-04-13 DIAGNOSIS — E039 Hypothyroidism, unspecified: Secondary | ICD-10-CM

## 2024-04-13 DIAGNOSIS — E785 Hyperlipidemia, unspecified: Secondary | ICD-10-CM

## 2024-04-13 DIAGNOSIS — E1165 Type 2 diabetes mellitus with hyperglycemia: Secondary | ICD-10-CM

## 2024-04-13 NOTE — Telephone Encounter (Signed)
 Contacted [employer], who confirmed that patient's plan is no longer active. They were not able to disclose the date the plan became active or was inactivated, and also were not able to provide the reason the plan was deactivated. Advised that I call the social security office for additional information.   Given that patient turned 67 last November, expect that he may no longer be eligible for Medicaid. Will investigate eligibility for Medicare Extra Help - which would provide him with improved coverage compared to his current commercial plan.   To address immediate medication access concerns, patient and son were advised to pick up one month supply of all maintenance medications from [facility] if they are able (total cost ~$76). For subsequent fills, will compare price of filling discounted cash price at [facility], if he is not eligible for Medicare LIS/Extra Help. Awaiting PA determinations for Xigduo and Trulicity .   Nicholas Johnston, PharmD [facility] [phone]

## 2024-04-14 ENCOUNTER — Other Ambulatory Visit (HOSPITAL_COMMUNITY): Payer: Self-pay

## 2024-04-14 MED ORDER — LEVOTHYROXINE SODIUM 25 MCG PO TABS
25.0000 ug | ORAL_TABLET | Freq: Every morning | ORAL | 5 refills | Status: DC
  Filled 2024-04-14: qty 30, 30d supply, fill #0

## 2024-04-14 MED ORDER — GLIPIZIDE ER 5 MG PO TB24
5.0000 mg | ORAL_TABLET | Freq: Every day | ORAL | 0 refills | Status: DC
  Filled 2024-04-14: qty 30, 30d supply, fill #0

## 2024-04-14 MED ORDER — ATORVASTATIN CALCIUM 80 MG PO TABS
80.0000 mg | ORAL_TABLET | Freq: Every day | ORAL | 11 refills | Status: DC
  Filled 2024-04-14: qty 30, 30d supply, fill #0

## 2024-04-14 MED ORDER — METFORMIN HCL ER 500 MG PO TB24
1000.0000 mg | ORAL_TABLET | Freq: Two times a day (BID) | ORAL | 11 refills | Status: AC
  Filled 2024-04-14: qty 120, 30d supply, fill #0

## 2024-04-14 MED ORDER — TRULICITY 0.75 MG/0.5ML ~~LOC~~ SOAJ: 0.7500 mg | SUBCUTANEOUS | 5 refills | Status: DC

## 2024-04-14 NOTE — Addendum Note (Signed)
 Addended by: BRINDA LORAIN SQUIBB on: 04/14/2024 04:53 PM   Modules accepted: Orders

## 2024-04-14 NOTE — Telephone Encounter (Signed)
 Contacted patient son Herb) to discuss medication prices and insurance. [name] stated that he picked up metformin  and atorvastatin  yesterday and plans to pick up levothyroxine  and glipizide  today. Discussed that patient is no longer enrolled in [place] and this is why copays have been higher.  Patient's insurance is not in network with our [employer], but cash prices for generic medications would be $29 per month (compared to $76/mo at [facility] through insurance). Patient's son would like to pick up  medications at [facility] next month. However, it is still recommended for him to start additional pharmacotherapy which would be a brand name medication and need to be filled at [facility] through insurance. Patient's son says that it would be feasible to pick up Trulicity  from [facility] and generic medications from [facility] to reduce monthly cost.   Will collaborate with PCP to place orders. Follow-up with PCP and pharmacist has been scheduled.   Lorain Baseman, PharmD [facility] [phone]

## 2024-05-22 ENCOUNTER — Other Ambulatory Visit: Payer: Self-pay | Admitting: Nurse Practitioner

## 2024-05-23 ENCOUNTER — Ambulatory Visit: Payer: Self-pay

## 2024-05-23 DIAGNOSIS — E1165 Type 2 diabetes mellitus with hyperglycemia: Secondary | ICD-10-CM | POA: Diagnosis not present

## 2024-05-23 MED ORDER — GLIPIZIDE ER 2.5 MG PO TB24: 2.5000 mg | ORAL_TABLET | Freq: Every day | ORAL | 3 refills | Status: AC

## 2024-05-23 NOTE — Patient Instructions (Addendum)
 It was nice to see you today!  Your goal blood sugar is 80-130 before eating and less than 180 after eating. Please bring your meter to all appointments  Please call this number to find out more about enrolling in [facility], [employer] Information Program [facility]) Coordinator: [phone] [email or web address]  If your income changes in the future (must be less than $23,475 per year) I can help you apply to Medicare Extra Help which would lower your medication costs.   Medication Changes: STOP glipizide  5 mg daily. Start lower dose of glipizide  2.5 mg once daily when you are able to pick it up from the pharmacy.  Continue metformin  XR 1000 mg (2 tablets) TWICE daily. I am going to check if a medicine call Xigduo (which contains metformin  and another medicine called dapagliflozin would be cheaper for him).   Continue atorvastatin  80 mg (1 tablet) once daily  Continue levothyroxine  25 mcg (1 tablet) once daily  Monitor blood sugars at home and keep a log (glucometer or piece of paper) to bring with you to your next visit.  Keep up the good work with diet and exercise. Aim for a diet full of vegetables, fruit and lean meats (chicken, [place], fish). Try to limit salt intake by eating fresh or frozen vegetables (instead of canned), rinse canned vegetables prior to cooking and do not add any additional salt to meals.

## 2024-05-23 NOTE — Progress Notes (Signed)
 05/23/2024 Name: Nicholas Johnston MRN: [id number] DOB: 1955/09/23  Chief Complaint  Patient presents with   Diabetes    Nicholas Johnston is a 68 y.o. year old male who presented for a face-to-face visit. PMH includes T2DM, HLD, vision impairment.    They were referred to the pharmacist by their PCP for assistance in managing diabetes. Visit conducted with assistance from [facility] interpreters.  Subjective: Patient was last seen by PCP, [name], NP on 03/08/24. A1C was stable at 9.4% (previously 9.2%). Patient reported taking metformin  as prescribed, but was unclear if he was taking glipizide . He reported home BG ranging 85-180s. He endorsed following a low-carb diet. He was counseled to bring all his medications to his upcoming appointment with pharmacy. He was seen in person by pharmacy on 04/12/24 with his son Nicholas Johnston. He had recently run out of some of his medications. He reported medication costs were expensive. We discussed starting Trulicity , which he could fill for $25/mo and fill remaining generic medications at [facility] pharmacy for total of $29 per moth. They agreed to this plan.   Patient reports doing ok. He is accompanied by his son Nicholas Johnston today. They report that they continued to fill all meds at [facility] and did not pick up Trulicity  as instructed. Patient does not want to take injectable medication at this time.   Care Team: Primary Care Provider: [name], [name], FNP ; Next Scheduled Visit: 06/09/24  Medication Access/Adherence  Current Pharmacy:  [id number] [place] [place], [place] - [address] AT [facility] [address] & [facility] [place] [address] Phone: [phone] Fax: [phone]  [place] - [facility] [address] [place] [address] Phone: [phone] Fax: [phone]  Patient reports affordability concerns with their medications: No  Patient reports access/transportation concerns to their pharmacy: No  Patient  reports adherence concerns with their medications:  Yes  - hx of nonadherence and difficulty affording medications  Current insurance access: patient only has insurance through work and copays are expensive. He is no longer eligible for Medicaid. Screened for Medicare LIS today and he is over income limit, but may be eligible once he retires.   Called [facility] previously - reported they had atorvastatin  and metformin  ready to pick up. Once they ran Rx for glipizide  and levothyroxine  - total cost (for 30 day supplies) went up to $76.72.  Diabetes:  Current medications: metformin  XR 1000 mg BID, glipizide  XL 5 mg daily Medications tried in the past: Semglee , Novolog  (stopped d/t pt inability to self inject insulin  correctly due to impaired vision, patient also noted he was unable to afford $35 copay)glipizide  IR (hypoglycemia), patient thinks he may have taken Trulicity  before in [place] .   Glucometer available for review today. Checking daily fasting BG and occasional PPG Recalls BG of 103, 104, 106. Sometimes ~ 74 mg/dL. Lowest 67 mg/dL (associated with symptoms) - this usually happens after work.   Patient denies hyperglycemic symptoms including polyuria, nocturia, neuropathy, blurred vision.   Current meal patterns: Son reports that he is avoiding sugary foods and drinks, but he does eat rice everyday and this is difficult for him to cut back on.  - Occasional morning snacks: muffin, coffee  11 AM and 1 PM meals - 11:00 AM: bread, green tea, low sugar beverage - 1:00 PM: hamburger, chicken wings, rice and curry  - Drinks: sometimes diet or zero soda, but never drinks a whole bottle, 4-5 bottles of  water per day   Hyperlipidemia/ASCVD Risk Reduction  Current lipid lowering medications: atorvastatin  80 mg daily   ASCVD History: none Family History: T2DM, HLD   Objective:  BP Readings from Last 3 Encounters:  03/08/24 132/79  12/01/23 111/79  10/19/23 126/85    Lab  Results  Component Value Date   HGBA1C 9.4 (A) 03/08/2024   HGBA1C 9.2 (A) 12/01/2023   HGBA1C 11.5 (A) 08/23/2023    Lab Results  Component Value Date   CREATININE 1.12 03/08/2024   BUN 22 03/08/2024   NA 139 03/08/2024   K 5.2 03/08/2024   CL 102 03/08/2024   CO2 25 03/08/2024    Lab Results  Component Value Date   CHOL 124 03/08/2024   HDL 38 (L) 03/08/2024   LDLCALC 61 03/08/2024   LDLDIRECT 91 08/23/2023   TRIG 143 03/08/2024   CHOLHDL 3.3 03/08/2024    Medications Reviewed Today     Reviewed by Brinda Lorain SQUIBB, RPH (Pharmacist) on 05/23/24 at 1735  Med List Status: <None>   Medication Order Taking? Sig Documenting Provider Last Dose Status Informant  atorvastatin  (LIPITOR) 80 MG tablet [id number] Yes Take 1 tablet (80 mg total) by mouth daily. [name], [name], FNP  Active     Discontinued 05/23/24 1549 (Patient Preference)   glipiZIDE  (GLUCOTROL  XL) 5 MG 24 hr tablet [id number] Yes Take 1 tablet (5 mg total) by mouth daily with breakfast. [name], [name], FNP  Active   levothyroxine  (SYNTHROID ) 25 MCG tablet [id number] Yes Take 1 tablet (25 mcg total) by mouth every morning. [name], [name], FNP  Active   metFORMIN  (GLUCOPHAGE -XR) 500 MG 24 hr tablet [id number] Yes Take 2 tablets (1,000 mg total) by mouth 2 (two) times daily with a meal. [name], [name], FNP  Active               Assessment/Plan:   Diabetes: - Currently uncontrolled based on last A1c 9.4% above goal < 7% stable from 9.2% previously. Patient is reporting controlled SMBG and some hypoglycemia today, though he does not have his glucometer to review. He likely continues to have large PPG BG excursions. Will dose reduce glipizide  today. Consider switching metformin  to Xigduo for additional glycemic control vs adding Rybelsus, though addition of medications is limited by cost.  - Reviewed long term cardiovascular and renal outcomes of uncontrolled blood sugar - Reviewed appropriate  treatment of hypoglycemia - Reviewed goal A1c, goal fasting, and goal 2 hour post prandial glucose - Recommend to continue metformin  XR 1000 mg BID - Recommend to REDUCE glipizide  XL to 2.5 mg daily with breakfast - Provided patient information on [employer] SHIP program to enroll in Medicare plan.  - Per CPhT Xiguduo available without PA, though unable to run test claim at [facility] due to being OON.  - Recommend to check glucose fasting and 2 hr post prandial blood glucose daily - Confirmed no hx of pancreatitis or family hx of MEN2, MTC.    Hyperlipidemia/ASCVD Risk Reduction: - Currently controlled with last LDL 61 mg/dL, below goal <29 mg/dL given DM on high intensity statin  - Reviewed long term complications of uncontrolled cholesterol - Recommend to continue atorvastatin  80 mg daily   Follow Up Plan: PCP 06/27/24, Pharmacist in person 08/01/24   Lorain Brinda, PharmD [facility] [phone]

## 2024-06-09 ENCOUNTER — Ambulatory Visit: Payer: Self-pay | Admitting: Nurse Practitioner

## 2024-06-09 ENCOUNTER — Ambulatory Visit: Admitting: Nurse Practitioner

## 2024-06-09 ENCOUNTER — Encounter: Payer: Self-pay | Admitting: Nurse Practitioner

## 2024-06-09 VITALS — BP 108/68 | HR 82 | Wt 153.0 lb

## 2024-06-09 DIAGNOSIS — Z23 Encounter for immunization: Secondary | ICD-10-CM | POA: Diagnosis not present

## 2024-06-09 DIAGNOSIS — E1165 Type 2 diabetes mellitus with hyperglycemia: Secondary | ICD-10-CM

## 2024-06-09 DIAGNOSIS — E785 Hyperlipidemia, unspecified: Secondary | ICD-10-CM | POA: Diagnosis not present

## 2024-06-09 DIAGNOSIS — E039 Hypothyroidism, unspecified: Secondary | ICD-10-CM

## 2024-06-09 LAB — POCT GLYCOSYLATED HEMOGLOBIN (HGB A1C): Hemoglobin A1C: 10.3 % — AB (ref 4.0–5.6)

## 2024-06-09 MED ORDER — ATORVASTATIN CALCIUM 80 MG PO TABS: 80.0000 mg | ORAL_TABLET | Freq: Every day | ORAL | 1 refills | Status: AC

## 2024-06-09 NOTE — Assessment & Plan Note (Signed)
  Thyroid  function last assessed about six months ago. - Order thyroid  function test Continue levothyroxine  25 mcg daily

## 2024-06-09 NOTE — Patient Instructions (Addendum)
 Goal for fasting blood sugar ranges from 80 to 120 and 2 hours after any meal or at bedtime should be between 130 to 170.          v  It is important that you exercise regularly at least 30 minutes 5 times a week as tolerated  Think about what you will eat, plan ahead. Choose  clean, green, fresh or frozen over canned, processed or packaged foods which are more sugary, salty and fatty. 70 to 75% of food eaten should be vegetables and fruit. Three meals at set times with snacks allowed between meals, but they must be fruit or vegetables. Aim to eat over a 12 hour period , example 7 am to 7 pm, and STOP after  your last meal of the day. Drink water,generally about 64 ounces per day, no other drink is as healthy. Fruit juice is best enjoyed in a healthy way, by EATING the fruit.  Thanks for choosing [facility] we consider it a privelige to serve you.

## 2024-06-09 NOTE — Assessment & Plan Note (Signed)
 Lab Results  Component Value Date   HGBA1C 10.3 (A) 06/09/2024   A1c increased to 10.3%, indicating poor glycemic control. Prefers oral medications. Goal: A1c <7% to reduce cardiovascular and renal risks. - Ensure medication adherence and provide refills for all medications. Continue metformin  1000 mg twice daily, glipizide  2.5 mg daily - Educated on dietary modifications to reduce carbohydrate intake, emphasizing increased vegetables and protein. - Refer to a diabetes nutrition education class. - Pharmacist to follow up regarding potential addition of a new oral medication.Xiguduo  - Schedule follow-up in three months with fasting for cholesterol check.

## 2024-06-09 NOTE — Progress Notes (Signed)
 Established Patient Office Visit  Subjective:  Patient ID: Nicholas Johnston, male    DOB: 02-22-1956  Age: 68 y.o. MRN: [id number]  CC:  Chief Complaint  Patient presents with   Medical Management of Chronic Issues    HPI   Discussed the use of AI scribe software for clinical note transcription with the patient, who gave verbal consent to proceed.  History of Present Illness Nicholas Johnston is a 68 year old male  has a past medical history of Acute pyelonephritis (05/17/2023), Diabetes mellitus without complication (HCC), Dyslipidemia, goal LDL below 70 (05/26/2023), Hyperlipidemia, Insomnia (05/26/2023), and Uncontrolled type 2 diabetes mellitus with hyperglycemia (HCC) (05/26/2023). who presents for follow-up of his diabetes management.  He is accompanied by his son  He has been adhering to his medication regimen, although he previously experienced a lapse in medication due to cost issues, which have now been resolved. He currently has a limited supply of one of his medications. His hemoglobin A1c has increased from 9.4% three months ago to 10.3% today.  In terms of diet, he consumes a significant amount of rice, which is a major source of carbohydrates. He avoids sugary drinks but includes soup, vegetables, and fruit in his diet. He works eight-hour shifts, which he finds lengthy, and would prefer shorter six-hour shifts if possible.  His son assists him with organizing his medications into a pill box to ensure proper adherence.    Assessment & Plan     Past Medical History:  Diagnosis Date   Acute pyelonephritis 05/17/2023   Diabetes mellitus without complication (HCC)    Dyslipidemia, goal LDL below 70 05/26/2023   Hyperlipidemia    Insomnia 05/26/2023   Uncontrolled type 2 diabetes mellitus with hyperglycemia (HCC) 05/26/2023    History reviewed. No pertinent surgical history.  Family History  Family history unknown: Yes    Social History   Socioeconomic History   Marital  status: Married    Spouse name: Sa Nay   Number of children: 3   Years of education: Not on file   Highest education level: Not on file  Occupational History   Not on file  Tobacco Use   Smoking status: Never   Smokeless tobacco: Never  Vaping Use   Vaping status: Never Used  Substance and Sexual Activity   Alcohol use: Not Currently    Comment: occ   Drug use: No   Sexual activity: Not Currently  Other Topics Concern   Not on file  Social History Narrative   Lives with his family    Social Drivers of Corporate investment banker Strain: Not on file  Food Insecurity: No Food Insecurity (05/24/2023)   Hunger Vital Sign    Worried About Running Out of Food in the Last Year: Never true    Ran Out of Food in the Last Year: Never true  Transportation Needs: No Transportation Needs (05/24/2023)   PRAPARE - Administrator, Civil Service (Medical): No    Lack of Transportation (Non-Medical): No  Physical Activity: Not on file  Stress: Not on file  Social Connections: Not on file  Intimate Partner Violence: Not At Risk (05/17/2023)   Humiliation, Afraid, Rape, and Kick questionnaire    Fear of Current or Ex-Partner: No    Emotionally Abused: No    Physically Abused: No    Sexually Abused: No    Outpatient Medications Prior to Visit  Medication Sig Dispense Refill   glipiZIDE  (GLUCOTROL  XL) 2.5 MG 24  hr tablet Take 1 tablet (2.5 mg total) by mouth daily with breakfast. 90 tablet 3   levothyroxine  (SYNTHROID ) 25 MCG tablet Take 1 tablet (25 mcg total) by mouth every morning. 30 tablet 5   metFORMIN  (GLUCOPHAGE -XR) 500 MG 24 hr tablet Take 2 tablets (1,000 mg total) by mouth 2 (two) times daily with a meal. 120 tablet 11   atorvastatin  (LIPITOR) 80 MG tablet Take 1 tablet (80 mg total) by mouth daily. 30 tablet 11   No facility-administered medications prior to visit.    No Known Allergies  ROS Review of Systems  Constitutional:  Negative for appetite change,  chills, fatigue and fever.  HENT:  Negative for congestion, postnasal drip, rhinorrhea and sneezing.   Respiratory:  Negative for cough, shortness of breath and wheezing.   Cardiovascular:  Negative for chest pain, palpitations and leg swelling.  Gastrointestinal:  Negative for abdominal pain, constipation, nausea and vomiting.  Genitourinary:  Negative for difficulty urinating, dysuria, flank pain and frequency.  Musculoskeletal:  Negative for arthralgias, back pain, joint swelling and myalgias.  Skin:  Negative for color change, pallor, rash and wound.  Neurological:  Negative for dizziness, facial asymmetry, weakness, numbness and headaches.  Psychiatric/Behavioral:  Negative for behavioral problems, confusion, self-injury and suicidal ideas.       Objective:    Physical Exam Vitals and nursing note reviewed.  Constitutional:      General: He is not in acute distress.    Appearance: Normal appearance. He is not ill-appearing, toxic-appearing or diaphoretic.  Eyes:     General: No scleral icterus.       Right eye: No discharge.        Left eye: No discharge.     Extraocular Movements: Extraocular movements intact.     Conjunctiva/sclera: Conjunctivae normal.  Cardiovascular:     Rate and Rhythm: Normal rate and regular rhythm.     Pulses: Normal pulses.     Heart sounds: Normal heart sounds. No murmur heard.    No friction rub. No gallop.  Pulmonary:     Effort: Pulmonary effort is normal. No respiratory distress.     Breath sounds: Normal breath sounds. No stridor. No wheezing, rhonchi or rales.  Chest:     Chest wall: No tenderness.  Abdominal:     General: There is no distension.     Palpations: Abdomen is soft.     Tenderness: There is no abdominal tenderness. There is no right CVA tenderness, left CVA tenderness or guarding.  Musculoskeletal:        General: No swelling, tenderness, deformity or signs of injury.     Right lower leg: No edema.     Left lower leg: No  edema.  Skin:    General: Skin is warm and dry.     Capillary Refill: Capillary refill takes 2 to 3 seconds.     Coloration: Skin is not jaundiced or pale.     Findings: No bruising, erythema or lesion.  Neurological:     Mental Status: He is alert and oriented to person, place, and time.     Motor: No weakness.     Gait: Gait normal.  Psychiatric:        Mood and Affect: Mood normal.        Behavior: Behavior normal.        Thought Content: Thought content normal.        Judgment: Judgment normal.     BP 108/68   Pulse 82  Wt 153 lb (69.4 kg)   SpO2 97%   BMI 25.46 kg/m  Wt Readings from Last 3 Encounters:  06/09/24 153 lb (69.4 kg)  03/08/24 153 lb (69.4 kg)  12/01/23 155 lb (70.3 kg)    Lab Results  Component Value Date   TSH 2.260 12/01/2023   Lab Results  Component Value Date   WBC 6.2 08/23/2023   HGB 13.5 08/23/2023   HCT 41.8 08/23/2023   MCV 87 08/23/2023   PLT 203 08/23/2023   Lab Results  Component Value Date   NA 139 03/08/2024   K 5.2 03/08/2024   CO2 25 03/08/2024   GLUCOSE 184 (H) 03/08/2024   BUN 22 03/08/2024   CREATININE 1.12 03/08/2024   BILITOT 0.5 03/08/2024   ALKPHOS 100 03/08/2024   AST 26 03/08/2024   ALT 31 03/08/2024   PROT 7.2 03/08/2024   ALBUMIN 4.3 03/08/2024   CALCIUM  9.7 03/08/2024   ANIONGAP 12 05/20/2023   EGFR 73 03/08/2024   Lab Results  Component Value Date   CHOL 124 03/08/2024   Lab Results  Component Value Date   HDL 38 (L) 03/08/2024   Lab Results  Component Value Date   LDLCALC 61 03/08/2024   Lab Results  Component Value Date   TRIG 143 03/08/2024   Lab Results  Component Value Date   CHOLHDL 3.3 03/08/2024   Lab Results  Component Value Date   HGBA1C 10.3 (A) 06/09/2024      Assessment & Plan:   Problem List Items Addressed This Visit       Endocrine   Uncontrolled type 2 diabetes mellitus with hyperglycemia (HCC) - Primary   Lab Results  Component Value Date   HGBA1C 10.3 (A)  06/09/2024   A1c increased to 10.3%, indicating poor glycemic control. Prefers oral medications. Goal: A1c <7% to reduce cardiovascular and renal risks. - Ensure medication adherence and provide refills for all medications. Continue metformin  1000 mg twice daily, glipizide  2.5 mg daily - Educated on dietary modifications to reduce carbohydrate intake, emphasizing increased vegetables and protein. - Refer to a diabetes nutrition education class. - Pharmacist to follow up regarding potential addition of a new oral medication.Xiguduo  - Schedule follow-up in three months with fasting for cholesterol check.       Relevant Medications   atorvastatin  (LIPITOR) 80 MG tablet   Other Relevant Orders   POCT glycosylated hemoglobin (Hb A1C) (Completed)   Amb Referral to Nutrition and Diabetic Education   Hypothyroidism    Thyroid  function last assessed about six months ago. - Order thyroid  function test Continue levothyroxine  25 mcg daily      Relevant Orders   TSH + free T4     Other   Dyslipidemia, goal LDL below 70   Lab Results  Component Value Date   CHOL 124 03/08/2024   HDL 38 (L) 03/08/2024   LDLCALC 61 03/08/2024   LDLDIRECT 91 08/23/2023   TRIG 143 03/08/2024   CHOLHDL 3.3 03/08/2024   Cholesterol levels controlled on Atorvastatin . - Provide refill for Atorvastatin  80 mg daily - Reassess cholesterol levels in three months.      Relevant Medications   atorvastatin  (LIPITOR) 80 MG tablet   Need for influenza vaccination   Relevant Orders   Flu vaccine HIGH DOSE PF(Fluzone Trivalent) (Completed)    Meds ordered this encounter  Medications   atorvastatin  (LIPITOR) 80 MG tablet    Sig: Take 1 tablet (80 mg total) by mouth daily.  Dispense:  90 tablet    Refill:  1    Profile medication, fill cash price when requested (insurance OON, but pt has high copays through ins)    Follow-up: Return in about 3 months (around 09/09/2024) for CPE.    Antoinne Spadaccini R Jermari Tamargo,  FNP

## 2024-06-09 NOTE — Assessment & Plan Note (Signed)
 Lab Results  Component Value Date   CHOL 124 03/08/2024   HDL 38 (L) 03/08/2024   LDLCALC 61 03/08/2024   LDLDIRECT 91 08/23/2023   TRIG 143 03/08/2024   CHOLHDL 3.3 03/08/2024   Cholesterol levels controlled on Atorvastatin . - Provide refill for Atorvastatin  80 mg daily - Reassess cholesterol levels in three months.

## 2024-06-10 LAB — TSH+FREE T4
Free T4: 1.2 ng/dL (ref 0.82–1.77)
TSH: 2.44 u[IU]/mL (ref 0.450–4.500)

## 2024-06-12 ENCOUNTER — Other Ambulatory Visit: Payer: Self-pay | Admitting: Nurse Practitioner

## 2024-06-12 DIAGNOSIS — E039 Hypothyroidism, unspecified: Secondary | ICD-10-CM

## 2024-06-12 MED ORDER — LEVOTHYROXINE SODIUM 25 MCG PO TABS: 25.0000 ug | ORAL_TABLET | Freq: Every morning | ORAL | 3 refills | Status: AC

## 2024-06-13 ENCOUNTER — Other Ambulatory Visit: Payer: Self-pay

## 2024-06-13 DIAGNOSIS — E1165 Type 2 diabetes mellitus with hyperglycemia: Secondary | ICD-10-CM

## 2024-06-13 MED ORDER — RYBELSUS 3 MG PO TABS: 3.0000 mg | ORAL_TABLET | Freq: Every day | ORAL | 1 refills | Status: DC

## 2024-06-13 NOTE — Progress Notes (Unsigned)
 Ordering Rybelsus to patient's preferred pharmacy to investigate cost. Provided coupon card information. Will outreach patient to discuss initiation, if affordable.     Lorain Baseman, PharmD [facility] [phone]

## 2024-06-15 ENCOUNTER — Telehealth: Payer: Self-pay | Admitting: Pharmacy Technician

## 2024-06-15 NOTE — Progress Notes (Signed)
   06/15/2024 Name: Nicholas Johnston MRN: [id number] DOB: 09-14-1955  Patient is appearing for a follow-up visit with the population health pharmacy technician. Last engaged with the clinical pharmacist to discuss diabetes on 05/23/2024. Contacted pharmacy today to discuss medication access.   Plan from last clinical pharmacist appointment:  Diabetes: - Currently uncontrolled based on last A1c 9.4% above goal < 7% stable from 9.2% previously. Patient is reporting controlled SMBG and some hypoglycemia today, though he does not have his glucometer to review. He likely continues to have large PPG BG excursions. Will dose reduce glipizide  today. Consider switching metformin  to Xigduo for additional glycemic control vs adding Rybelsus, though addition of medications is limited by cost.  - Reviewed long term cardiovascular and renal outcomes of uncontrolled blood sugar - Reviewed appropriate treatment of hypoglycemia - Reviewed goal A1c, goal fasting, and goal 2 hour post prandial glucose - Recommend to continue metformin  XR 1000 mg BID - Recommend to REDUCE glipizide  XL to 2.5 mg daily with breakfast - Provided patient information on [place] SHIP program to enroll in Medicare plan.  - Per CPhT Xiguduo available without PA, though unable to run test claim at [facility] due to being OON.  - Recommend to check glucose fasting and 2 hr post prandial blood glucose daily - Confirmed no hx of pancreatitis or family hx of MEN2, MT(copy/paste from last note)   Medication Adherence Barriers Identified:  Access issues with any new medication or testing device: Yes Rybelsus   Medication Adherence Barriers Addressed/Actions Taken:  Reviewed medication changes per plan from last clinical pharmacist note Medication Access for Rybelsus Will discuss medication access concerns with pharmacist Contacted pharmacy regarding new prescriptions Spoke to [facility] pharmacy staff. Staff informed they received Rybelsus  prescription and applied coupon card that was sent with prescription. The cost of Ryblesus for 30 tablets with coupon card is $14.99. Will notify PharmD who will call patient and discuss initiation of new medication.   Next clinical pharmacist appointment is scheduled for: 08/01/2024  Nikko Quast, CPhT [facility] Office: [phone] Email: [email or web address]

## 2024-06-27 ENCOUNTER — Ambulatory Visit: Payer: Self-pay | Admitting: Nurse Practitioner

## 2024-06-27 ENCOUNTER — Telehealth: Payer: Self-pay

## 2024-06-27 NOTE — Telephone Encounter (Signed)
 Called patient's son, [name], to discuss initiation of new medication Rybelsus 3 mg once daily given worsened A1C at most recent PCP appt. Son reported that cost of $15.99 through insurance was ok. Educated on administration instructions including taking 30 minutes before other food or drink in the morning. Discussed potential GI AE and how to avoid. Instructed them to continue all other medications the same. Patient's son expressed understanding of plan.   Called pharmacy to ensure that Rybelsus would be ready for pick up this week.   Pharmacy follow-up: scheduled for 08/01/24  Lorain Baseman, PharmD [facility] [phone]

## 2024-07-31 DIAGNOSIS — E119 Type 2 diabetes mellitus without complications: Secondary | ICD-10-CM | POA: Diagnosis not present

## 2024-08-01 ENCOUNTER — Ambulatory Visit: Payer: Self-pay

## 2024-08-01 ENCOUNTER — Telehealth: Payer: Self-pay

## 2024-08-01 NOTE — Progress Notes (Deleted)
 08/01/2024 Name: Nicholas Johnston MRN: [id number] DOB: December 07, 1955  No chief complaint on file.   Nicholas Johnston is a 68 y.o. year old male who presented for a face-to-face visit. PMH includes T2DM, HLD, vision impairment.    They were referred to the pharmacist by their PCP for assistance in managing diabetes. Visit conducted with assistance from [facility] interpreters.  Subjective: Patient was last seen by PCP, Nicholas Shall, NP on 06/09/24. A1C had increased to 10.3% despite patient reporting controlled SMBG and hypoglycemia at recent visits. They had been instructed at a previous pharmacy visit to start Trulicity , but they did not due to patient not wanting to take injectable medication. Following increase in A1C, I spoke with patient's son Nicholas Johnston on 06/27/24 and recommended initiation of Rybelsus  3 mg daily. Patient's son was agreeable to plan.  Today, ***.  Care Team: Primary Care Provider: [name], [name], FNP ; Next Scheduled Visit: 06/09/24  Medication Access/Adherence  Current Pharmacy:  [id number] [address] [place], [place] - [address] AT [facility] [address] [facility] [address] [place] [address] Phone: [phone] Fax: [phone]  [facility] [facility] [address] [place] [address] Phone: [phone] Fax: [phone]  Patient reports affordability concerns with their medications: No  Patient reports access/transportation concerns to their pharmacy: No  Patient reports adherence concerns with their medications:  Yes  - hx of nonadherence and difficulty affording medications  Current insurance access: patient only has insurance through work and copays are expensive. He is no longer eligible for Medicaid. Screened for Medicare LIS today and he is over income limit, but may be eligible once he retires.   Called [facility] previously - reported they had atorvastatin  and metformin  ready to pick up. Once they ran Rx for glipizide  and  levothyroxine  - total cost (for 30 day supplies) went up to $76.72.  Diabetes:  Current medications: metformin  XR 1000 mg BID, glipizide  XL 5 mg daily Medications tried in the past: Semglee , Novolog  (stopped d/t pt inability to self inject insulin  correctly due to impaired vision, patient also noted he was unable to afford $35 copay)glipizide  IR (hypoglycemia), patient thinks he may have taken Trulicity  before in [place] .   Glucometer available for review today. Checking daily fasting BG and occasional PPG Recalls BG of 103, 104, 106. Sometimes ~ 74 mg/dL. Lowest 67 mg/dL (associated with symptoms) - this usually happens after work.   Patient denies hyperglycemic symptoms including polyuria, nocturia, neuropathy, blurred vision.   Current meal patterns: Son reports that he is avoiding sugary foods and drinks, but he does eat rice everyday and this is difficult for him to cut back on.  - Occasional morning snacks: muffin, coffee  11 AM and 1 PM meals - 11:00 AM: bread, green tea, low sugar beverage - 1:00 PM: hamburger, chicken wings, rice and curry  - Drinks: sometimes diet or zero soda, but never drinks a whole bottle, 4-5 bottles of water per day   Hyperlipidemia/ASCVD Risk Reduction  Current lipid lowering medications: atorvastatin  80 mg daily   ASCVD History: none Family History: T2DM, HLD   Objective:  BP Readings from Last 3 Encounters:  06/09/24 108/68  03/08/24 132/79  12/01/23 111/79    Lab Results  Component Value Date   HGBA1C 10.3 (A) 06/09/2024   HGBA1C 9.4 (A) 03/08/2024   HGBA1C 9.2 (A) 12/01/2023    Lab Results  Component Value Date   CREATININE 1.12 03/08/2024  BUN 22 03/08/2024   NA 139 03/08/2024   K 5.2 03/08/2024   CL 102 03/08/2024   CO2 25 03/08/2024    Lab Results  Component Value Date   CHOL 124 03/08/2024   HDL 38 (L) 03/08/2024   LDLCALC 61 03/08/2024   LDLDIRECT 91 08/23/2023   TRIG 143 03/08/2024   CHOLHDL 3.3  03/08/2024    Medications Reviewed Today   Medications were not reviewed in this encounter       Assessment/Plan:   Diabetes: - Currently uncontrolled based on last A1c 9.4% above goal < 7% stable from 9.2% previously. Patient is reporting controlled SMBG and some hypoglycemia today, though he does not have his glucometer to review. He likely continues to have large PPG BG excursions. Will dose reduce glipizide  today. Consider switching metformin  to Xigduo for additional glycemic control vs adding Rybelsus , though addition of medications is limited by cost.  - Reviewed long term cardiovascular and renal outcomes of uncontrolled blood sugar - Reviewed appropriate treatment of hypoglycemia - Reviewed goal A1c, goal fasting, and goal 2 hour post prandial glucose - Recommend to continue metformin  XR 1000 mg BID - Recommend to REDUCE glipizide  XL to 2.5 mg daily with breakfast - Provided patient information on [place] [employer] program to enroll in Medicare plan.  - Per CPhT Xiguduo available without PA, though unable to run test claim at [facility] due to being OON.  - Recommend to check glucose fasting and 2 hr post prandial blood glucose daily - Confirmed no hx of pancreatitis or family hx of MEN2, MTC.    Hyperlipidemia/ASCVD Risk Reduction: - Currently controlled with last LDL 61 mg/dL, below goal <29 mg/dL given DM on high intensity statin  - Reviewed long term complications of uncontrolled cholesterol - Recommend to continue atorvastatin  80 mg daily   Follow Up Plan: PCP 06/27/24, Pharmacist in person 08/01/24   Lorain Baseman, PharmD [facility] [phone]

## 2024-08-01 NOTE — Telephone Encounter (Signed)
 Attempted to contact patient for scheduled appointment for medication management. Left HIPAA compliant message for patient to return my call at their convenience.   Successfully contacted patient's son [name]. He was at work, but was able to confirm that the patient had picked up and started taking Rybelsus . Denies tolerability issues. Per fill hx, due for refill. Called pharmacy to request refills of maintenance medications.  Will attempt to reschedule pharmacy appt when able.   Lorain Baseman, PharmD [facility] [phone]

## 2024-08-08 ENCOUNTER — Encounter: Admitting: Skilled Nursing Facility1

## 2024-08-21 ENCOUNTER — Encounter: Admitting: Skilled Nursing Facility1

## 2024-08-28 ENCOUNTER — Telehealth: Payer: Self-pay

## 2024-08-28 NOTE — Progress Notes (Signed)
 Complex Care Management Care Guide Note  08/28/2024 Name: Nicholas Johnston MRN: [id number] DOB: 1956-03-10  Nicholas Johnston is a 69 y.o. year old male who is a primary care patient of [name], [name], FNP and is actively engaged with the care management team. I reached out to [facility] by phone today to assist with re-scheduling  with the Pharmacist.  Follow up plan: Unsuccessful telephone outreach attempt made. A HIPAA compliant phone message was left for the patient providing contact information and requesting a return call.  Leotis Rase [facility], [facility] Guide  Direct Dial : [phone]  Fax [phone]

## 2024-08-28 NOTE — Progress Notes (Signed)
 Complex Care Management Care Guide Note  08/28/2024 Name: Nicholas Johnston MRN: [id number] DOB: 1956-03-10  Nicholas Johnston is a 69 y.o. year old male who is a primary care patient of [name], [name], FNP and is actively engaged with the care management team. I reached out to [facility] by phone today to assist with re-scheduling  with the Pharmacist.  Follow up plan: Unsuccessful telephone outreach attempt made. A HIPAA compliant phone message was left for the patient providing contact information and requesting a return call.  Nicholas Johnston, [facility] Guide  Direct Dial : [phone]  Fax [phone]

## 2024-09-05 ENCOUNTER — Telehealth: Payer: Self-pay | Admitting: Nurse Practitioner

## 2024-09-05 ENCOUNTER — Telehealth: Payer: Self-pay | Admitting: Pharmacy Technician

## 2024-09-05 DIAGNOSIS — E1165 Type 2 diabetes mellitus with hyperglycemia: Secondary | ICD-10-CM

## 2024-09-05 MED ORDER — RYBELSUS 3 MG PO TABS: 3.0000 mg | ORAL_TABLET | Freq: Every day | ORAL | 1 refills | Status: DC

## 2024-09-05 NOTE — Telephone Encounter (Signed)
 Semaglutide  (RYBELSUS ) 3 MG TABS [[id number]]

## 2024-09-05 NOTE — Telephone Encounter (Signed)
 Prescription sent as requested. CB.

## 2024-09-05 NOTE — Progress Notes (Signed)
" ° °  09/05/2024 Name: Nicholas Johnston MRN: [id number] DOB: 1956-02-10  Patient is appearing for a follow-up visit with the population health pharmacy technician. Last engaged with the clinical pharmacist to discuss medication management (Rybelsus )  on 06/27/2024. Contacted patient today to discuss diabetes and medication adherence. HIPAA compliant voicemail left for patient. Contacted patient's son today to discuss diabetes. Son Nicholas Johnston picked up the phone call. HIPAA verified.  Plan from last clinical pharmacist appointment:  Called patient's son, Nicholas Johnston, to discuss initiation of new medication Rybelsus  3 mg once daily given worsened A1C at most recent PCP appt. Son reported that cost of $15.99 through insurance was ok. Educated on administration instructions including taking 30 minutes before other food or drink in the morning. Discussed potential GI AE and how to avoid. Instructed them to continue all other medications the same. Patient's son expressed understanding of plan. (copy/paste from last note)   Medication Adherence Barriers Identified:  Patient made recommended medication changes per plan: Yes Per patient's son, patient takes his medication as directed. However, he was not able to go over them with me today. Access issues with any new medication or testing device: No Patient's son informs patient calls [facility] when he needs refills. Per Dr Annemarie, Metformin  XR 500mg , Glipizide  XL 2.5mg  and Atorvastatin  80mg  were sold on 08/14/24 for 30 day supplies. Levothyroxine  last sold on 1/18 for 30. Rybelsus  last sold on 08/03/24 for 30.  Patient is checking blood sugars as prescribed: Yes Per patient's son, patient does check blood sugar. He informs morning blood sugars before he eats are 79, 80, 113 and after he eats, the blood sugars go up to 125.   Medication Adherence Barriers Addressed/Actions Taken:  Reviewed medication changes per plan from last clinical pharmacist note Medication Access for  Rybelsus  Will discuss medication access concerns with pharmacist Contacted pharmacy regarding Rybelsus  refill Per [facility] pharmacist, patient needs refills added to the prescription. [facility] pharmacist will send request over to MD office Educated patient to contact pharmacy regarding refills prior to running completely out of medications. Assisted with scheduling a patient outreach visit with Pharmacist Reviewed instructions for monitoring blood sugars at home and reminded patient to keep a written log to review with pharmacist Reminded patient of date/time of upcoming clinical pharmacist follow up and any upcoming PCP/specialists visits. Patient denies transportation barriers to the appointment. Yes  Next clinical pharmacist appointment is scheduled for: 10/05/2023 at 3:30pm  Kate Caddy, CPhT [facility] Office: [phone] Email: [email or web address]   "

## 2024-09-12 ENCOUNTER — Telehealth: Payer: Self-pay | Admitting: Nurse Practitioner

## 2024-09-12 ENCOUNTER — Other Ambulatory Visit: Payer: Self-pay

## 2024-09-12 NOTE — Telephone Encounter (Signed)
 Done [facility]

## 2024-09-12 NOTE — Telephone Encounter (Signed)
 Semaglutide  (RYBELSUS ) 3 MG TABS [[id number]]

## 2024-09-21 ENCOUNTER — Other Ambulatory Visit: Payer: Self-pay

## 2024-09-21 DIAGNOSIS — E1165 Type 2 diabetes mellitus with hyperglycemia: Secondary | ICD-10-CM

## 2024-09-21 MED ORDER — RYBELSUS 3 MG PO TABS: 3.0000 mg | ORAL_TABLET | Freq: Every day | ORAL | 0 refills | Status: AC

## 2024-10-04 ENCOUNTER — Other Ambulatory Visit: Payer: Self-pay

## 2024-10-09 ENCOUNTER — Encounter: Payer: Self-pay | Admitting: Nurse Practitioner
